# Patient Record
Sex: Female | Born: 1935 | Race: White | Hispanic: No | Smoking: Former smoker
Health system: Southern US, Community
[De-identification: ages and names within clinical notes are randomized; demographics above are authoritative.]

## PROBLEM LIST (undated history)

## (undated) DIAGNOSIS — E785 Hyperlipidemia, unspecified: Secondary | ICD-10-CM

## (undated) DIAGNOSIS — C50919 Malignant neoplasm of unspecified site of unspecified female breast: Secondary | ICD-10-CM

## (undated) DIAGNOSIS — I2510 Atherosclerotic heart disease of native coronary artery without angina pectoris: Secondary | ICD-10-CM

## (undated) DIAGNOSIS — E079 Disorder of thyroid, unspecified: Secondary | ICD-10-CM

## (undated) HISTORY — DX: Hyperlipidemia, unspecified: E78.5

## (undated) HISTORY — DX: Atherosclerotic heart disease of native coronary artery without angina pectoris: I25.10

## (undated) HISTORY — PX: OTHER SURGICAL HISTORY: SHX169

## (undated) HISTORY — DX: Malignant neoplasm of unspecified site of unspecified female breast: C50.919

## (undated) HISTORY — PX: CORONARY ANGIOPLASTY: SHX604

## (undated) HISTORY — PX: BREAST LUMPECTOMY: SHX2

## (undated) HISTORY — PX: CARDIAC CATHETERIZATION: SHX172

---

## 2012-04-20 ENCOUNTER — Inpatient Hospital Stay (HOSPITAL_COMMUNITY)
Admission: EM | Admit: 2012-04-20 | Discharge: 2012-04-22 | DRG: 069 | Disposition: A | Discharge status: Home / Self Care | Payer: Medicare Other | Attending: Internal Medicine | Admitting: Internal Medicine

## 2012-04-20 ENCOUNTER — Emergency Department (HOSPITAL_COMMUNITY): Payer: Medicare Other

## 2012-04-20 ENCOUNTER — Encounter (HOSPITAL_COMMUNITY): Payer: Self-pay | Admitting: Nurse Practitioner

## 2012-04-20 DIAGNOSIS — G459 Transient cerebral ischemic attack, unspecified: Principal | ICD-10-CM | POA: Diagnosis present

## 2012-04-20 DIAGNOSIS — Z79899 Other long term (current) drug therapy: Secondary | ICD-10-CM

## 2012-04-20 DIAGNOSIS — E119 Type 2 diabetes mellitus without complications: Secondary | ICD-10-CM | POA: Diagnosis present

## 2012-04-20 DIAGNOSIS — E032 Hypothyroidism due to medicaments and other exogenous substances: Secondary | ICD-10-CM

## 2012-04-20 DIAGNOSIS — R42 Dizziness and giddiness: Secondary | ICD-10-CM

## 2012-04-20 DIAGNOSIS — E782 Mixed hyperlipidemia: Secondary | ICD-10-CM

## 2012-04-20 DIAGNOSIS — E860 Dehydration: Secondary | ICD-10-CM | POA: Diagnosis present

## 2012-04-20 DIAGNOSIS — E785 Hyperlipidemia, unspecified: Secondary | ICD-10-CM | POA: Diagnosis present

## 2012-04-20 DIAGNOSIS — Z853 Personal history of malignant neoplasm of breast: Secondary | ICD-10-CM

## 2012-04-20 DIAGNOSIS — E039 Hypothyroidism, unspecified: Secondary | ICD-10-CM | POA: Diagnosis present

## 2012-04-20 HISTORY — DX: Disorder of thyroid, unspecified: E07.9

## 2012-04-20 LAB — CBC WITH DIFFERENTIAL/PLATELET
Eosinophils Relative: 2 % (ref 0–5)
HCT: 37.8 % (ref 36.0–46.0)
Hemoglobin: 12.6 g/dL (ref 12.0–15.0)
Lymphocytes Relative: 26 % (ref 12–46)
MCHC: 33.3 g/dL (ref 30.0–36.0)
MCV: 84.9 fL (ref 78.0–100.0)
Monocytes Absolute: 0.5 10*3/uL (ref 0.1–1.0)
Monocytes Relative: 7 % (ref 3–12)
Neutro Abs: 4.6 10*3/uL (ref 1.7–7.7)
WBC: 7.2 10*3/uL (ref 4.0–10.5)

## 2012-04-20 LAB — CBC
HCT: 35.5 % — ABNORMAL LOW (ref 36.0–46.0)
Hemoglobin: 11.3 g/dL — ABNORMAL LOW (ref 12.0–15.0)
MCH: 27 pg (ref 26.0–34.0)
MCV: 84.9 fL (ref 78.0–100.0)
Platelets: 222 10*3/uL (ref 150–400)
RBC: 4.18 MIL/uL (ref 3.87–5.11)
WBC: 6.1 10*3/uL (ref 4.0–10.5)

## 2012-04-20 LAB — COMPREHENSIVE METABOLIC PANEL
BUN: 14 mg/dL (ref 6–23)
CO2: 22 mEq/L (ref 19–32)
Calcium: 9.8 mg/dL (ref 8.4–10.5)
Chloride: 104 mEq/L (ref 96–112)
Creatinine, Ser: 0.76 mg/dL (ref 0.50–1.10)
GFR calc Af Amer: >90 mL/min (ref >90)
GFR calc non Af Amer: 79 mL/min — ABNORMAL LOW (ref >90)
Glucose, Bld: 80 mg/dL (ref 70–99)
Total Bilirubin: 0.2 mg/dL — ABNORMAL LOW (ref 0.3–1.2)

## 2012-04-20 LAB — T4, FREE: Free T4: 1.32 ng/dL (ref 0.80–1.80)

## 2012-04-20 LAB — CARDIAC PANEL(CRET KIN+CKTOT+MB+TROPI)
CK, MB: 3.4 ng/mL (ref 0.3–4.0)
CK, MB: 3 ng/mL (ref 0.3–4.0)
Relative Index: INVALID (ref 0.0–2.5)
Total CK: 79 U/L (ref 7–177)
Total CK: 61 U/L (ref 7–177)
Troponin I: <0.3 ng/mL (ref <0.30)

## 2012-04-20 LAB — CREATININE, SERUM: GFR calc Af Amer: 75 mL/min — ABNORMAL LOW (ref >90)

## 2012-04-20 MED ORDER — SODIUM CHLORIDE 0.9 % IV BOLUS (SEPSIS)
500.0000 mL | Freq: Once | INTRAVENOUS | Status: AC
  Administered 2012-04-20: 500 mL via INTRAVENOUS

## 2012-04-20 MED ORDER — SODIUM CHLORIDE 0.9 % IV SOLN
INTRAVENOUS | Status: DC
  Administered 2012-04-20 – 2012-04-21 (×2): via INTRAVENOUS

## 2012-04-20 MED ORDER — ASPIRIN 325 MG PO TABS
325.0000 mg | ORAL_TABLET | Freq: Every day | ORAL | Status: DC
  Administered 2012-04-20 – 2012-04-22 (×3): 325 mg via ORAL
  Filled 2012-04-20 (×3): qty 1

## 2012-04-20 MED ORDER — INSULIN ASPART 100 UNIT/ML ~~LOC~~ SOLN: 0.0000 [IU] | Freq: Every day | SUBCUTANEOUS | Status: DC

## 2012-04-20 MED ORDER — MECLIZINE HCL 25 MG PO TABS
25.0000 mg | ORAL_TABLET | Freq: Once | ORAL | Status: AC
  Administered 2012-04-20: 25 mg via ORAL
  Filled 2012-04-20: qty 1

## 2012-04-20 MED ORDER — ATORVASTATIN CALCIUM 10 MG PO TABS
10.0000 mg | ORAL_TABLET | Freq: Every day | ORAL | Status: DC
  Administered 2012-04-20 – 2012-04-21 (×2): 10 mg via ORAL
  Filled 2012-04-20 (×3): qty 1

## 2012-04-20 MED ORDER — LEVOTHYROXINE SODIUM 112 MCG PO TABS
112.0000 ug | ORAL_TABLET | Freq: Every day | ORAL | Status: DC
  Administered 2012-04-20 – 2012-04-22 (×3): 112 ug via ORAL
  Filled 2012-04-20 (×3): qty 1

## 2012-04-20 MED ORDER — ASPIRIN 81 MG PO CHEW
CHEWABLE_TABLET | ORAL | Status: AC
  Filled 2012-04-20: qty 4

## 2012-04-20 MED ORDER — ASPIRIN 300 MG RE SUPP
300.0000 mg | Freq: Every day | RECTAL | Status: DC
  Filled 2012-04-20 (×3): qty 1

## 2012-04-20 MED ORDER — INSULIN ASPART 100 UNIT/ML ~~LOC~~ SOLN
0.0000 [IU] | Freq: Three times a day (TID) | SUBCUTANEOUS | Status: DC
  Administered 2012-04-21: 2 [IU] via SUBCUTANEOUS
  Administered 2012-04-22: 1 [IU] via SUBCUTANEOUS
  Administered 2012-04-22: 2 [IU] via SUBCUTANEOUS

## 2012-04-20 MED ORDER — SENNOSIDES-DOCUSATE SODIUM 8.6-50 MG PO TABS
1.0000 | ORAL_TABLET | Freq: Every evening | ORAL | Status: DC | PRN
  Filled 2012-04-20: qty 1

## 2012-04-20 MED ORDER — GADOBENATE DIMEGLUMINE 529 MG/ML IV SOLN
15.0000 mL | Freq: Once | INTRAVENOUS | Status: AC
  Administered 2012-04-20: 15 mL via INTRAVENOUS

## 2012-04-20 MED ORDER — ENOXAPARIN SODIUM 40 MG/0.4ML ~~LOC~~ SOLN
40.0000 mg | SUBCUTANEOUS | Status: DC
  Administered 2012-04-20 – 2012-04-21 (×2): 40 mg via SUBCUTANEOUS
  Filled 2012-04-20 (×3): qty 0.4

## 2012-04-20 MED ORDER — ONDANSETRON HCL 4 MG/2ML IJ SOLN: 4.0000 mg | Freq: Four times a day (QID) | INTRAMUSCULAR | Status: DC | PRN

## 2012-04-20 NOTE — ED Provider Notes (Signed)
5:40 PM Patient signed out to me by Doran Durand PA-C. Patient was placed in CDU pending MRI do to possible subacute ischemia on CT scan. MRI was done incorrectly and unlikely will be able to received definitive results. Cannot repeat MRI for another 24 hours. Discussed patient with Dr. Weldon Inches. Patient will be admitted due symptoms and likely plan an MRI in 24 hours. Discussed this with patient and family. They voice understanding. Pt is visiting daughter from [facility]. PCP in [facility] is Dr. Lawanna Kobus   6:00 PM I have spoken with dr. Blake Divine, [facility] who accept patient for admission.   Telemetry: team 6: Dr. Ofilia Neas, PA-C 04/20/12 1801

## 2012-04-20 NOTE — ED Provider Notes (Signed)
Medical screening examination/treatment/procedure(s) were conducted as a shared visit with non-physician practitioner(s) and myself.  I personally evaluated the patient during the encounter   Glynn Octave, MD 04/20/12 1656

## 2012-04-20 NOTE — ED Provider Notes (Signed)
History     CSN: [id number]  Arrival date & time 04/20/12  4098   First MD Initiated Contact with Patient 04/20/12 1019      Chief Complaint  Patient presents with  . Dizziness    (Consider location/radiation/quality/duration/timing/severity/associated sxs/prior treatment) HPI Comments: Awoke this morning with dizziness and vertigo with one episode of vomiting.  Similar symptoms last week that resolved on their own.  No chest pain, SOB, abdominal pain, headache, weakness, numbness, tingling. Change in thyroid medication several weeks ago.  Visiting from [facility]. No history of heart or lung problems.  The history is provided by the patient and a relative.    Past Medical History  Diagnosis Date  . Diabetes mellitus   . Breast cancer   . Thyroid disease   . High cholesterol     Past Surgical History  Procedure Date  . Breast lumpectomy     History reviewed. No pertinent family history.  History  Substance Use Topics  . Smoking status: Never Smoker   . Smokeless tobacco: Not on file  . Alcohol Use: No    OB History    Grav Para Term Preterm Abortions TAB SAB Ect Mult Living                  Review of Systems  Constitutional: Positive for activity change and appetite change. Negative for fever.  HENT: Negative for congestion and rhinorrhea.   Respiratory: Negative for cough, chest tightness and shortness of breath.   Cardiovascular: Negative for chest pain.  Gastrointestinal: Positive for nausea and vomiting. Negative for abdominal pain.  Genitourinary: Negative for dysuria and hematuria.  Musculoskeletal: Negative for back pain.  Skin: Negative for rash.  Neurological: Positive for dizziness. Negative for syncope, weakness, light-headedness and headaches.    Allergies  Review of patient's allergies indicates no known allergies.  Home Medications   Current Outpatient Rx  Name Route Sig Dispense Refill  . ASPIRIN 81 MG PO TABS Oral Take 81 mg by mouth daily.     Marland Kitchen GLIPIZIDE ER 5 MG PO TB24 Oral Take 5 mg by mouth daily.    Marland Kitchen LEVOTHYROXINE SODIUM 112 MCG PO TABS Oral Take 112 mcg by mouth daily.    Marland Kitchen LIRAGLUTIDE 18 MG/3ML [facility] SOLN Subcutaneous Inject 1.2 mg into the skin every morning.    Marland Kitchen METFORMIN HCL 1000 MG PO TABS Oral Take 1,000 mg by mouth 2 (two) times daily with a meal.    . ROSUVASTATIN CALCIUM 5 MG PO TABS Oral Take 5 mg by mouth daily.      BP 136/73  Pulse 81  Temp 97.7 F (36.5 C) (Oral)  Resp 16  Ht 5\' 3"  (1.6 m)  Wt 158 lb (71.668 kg)  BMI 27.99 kg/m2  SpO2 99%  Physical Exam  Constitutional: She is oriented to person, place, and time. She appears well-developed and well-nourished. No distress.  HENT:  Head: Normocephalic and atraumatic.  Mouth/Throat: Oropharynx is clear and moist. No oropharyngeal exudate.  Eyes: Conjunctivae are normal. Pupils are equal, round, and reactive to light.  Neck: Normal range of motion. Neck supple.  Cardiovascular: Normal rate, regular rhythm and normal heart sounds.   No murmur heard. Pulmonary/Chest: Effort normal and breath sounds normal. No respiratory distress.  Abdominal: Soft. There is no tenderness. There is no rebound and no guarding.  Musculoskeletal: Normal range of motion. She exhibits no edema and no tenderness.  Neurological: She is alert and oriented to person, place, and time. No  cranial nerve deficit.       5/5 strength throughout, cranial nerves 3-12 intact. No nystagmus, no ataxia on finger to nose. +Romberg, ataxic gait. Test of skew negative. Head impulse testing negative.  Skin: Skin is warm.    ED Course  Procedures (including critical care time)  Labs Reviewed  COMPREHENSIVE METABOLIC PANEL - Abnormal; Notable for the following:    Total Bilirubin 0.2 (*)     GFR calc non Af Amer 79 (*)     All other components within normal limits  CBC WITH DIFFERENTIAL  CARDIAC PANEL(CRET KIN+CKTOT+MB+TROPI)  TSH  T4, FREE   Ct Head Wo Contrast  04/20/2012  *RADIOLOGY  REPORT*  Clinical Data: Dizziness.  Emesis.  CT HEAD WITHOUT CONTRAST  Technique:  Contiguous axial images were obtained from the base of the skull through the vertex without contrast.  Comparison: No priors.  Findings: Images 23 and 24 of series 2 demonstrate an area of low attenuation in the left frontal lobe white matter, favored to represent sequela of prior infarction (however, the margins are slightly indistinct).  Cerebral and cerebellar atrophy is present, and is age appropriate.  In addition, there are patchy and confluent areas of decreased attenuation throughout the deep and periventricular white matter of the cerebral hemispheres bilaterally, compatible with chronic microvascular ischemic changes.  No definite region of acute/subacute cerebral ischemia, no acute intracranial hemorrhage, no focal mass, mass effect, hydrocephalus or abnormal intra or extra-axial fluid collections. No acute displaced skull fractures are identified.  Visualized paranasal sinuses and mastoids are well pneumatized.  IMPRESSION: 1.  No definite acute intracranial abnormalities. There is, however, one well defined area of low attenuation has slight indistinctness of its margins in the anterior left frontal lobe white matter which could conceivably represent an area of evolving subacute ischemia, although this is more strongly favored to represent sequela of remote prior infarction.  This would require MRI for better discrimination if clinically indicated. 2.  Cerebral and cerebellar atrophy (age appropriate) with chronic microvascular ischemic changes in the cerebral white matter, as above.  Original Report Authenticated By: Florencia Reasons, M.D.     No diagnosis found.    MDM  Vertigo with nausea and vomiting.  No focal neuro deficits.  +Romberg and ataxic gait. Suspect peripheral vertigo given acute onset and lack of neuro findings.  CT, Labs, EKG, symptom control Symptoms improved with medications. Given CT  findings, will proceed with MRI. Move to CDU awaiting MRI.   Date: 04/20/2012  Rate: 77  Rhythm: normal sinus rhythm  QRS Axis: normal  Intervals: normal  ST/T Wave abnormalities: normal  Conduction Disutrbances:none  Narrative Interpretation:   Old EKG Reviewed: none available          Glynn Octave, MD 04/20/12 [phone]

## 2012-04-20 NOTE — ED Notes (Signed)
Patient transported to CT 

## 2012-04-20 NOTE — H&P (Signed)
[facility] History and Physical  Lacey Kemp ZOX:[id number] DOB: 10/13/1931 DOA: 04/20/2012  PCP: Provider Not In System   Chief Complaint: dizziness today followed by nausea and an episode of vomiting.  HPI:  78 year old lady with h/o DM, hypothyroidism was brought in by her daughter for an episode of dizziness associated with nausea and vomiting. She was unsteady on her feet. On arrival to ED, she was given meclizine . Her dizziness and nausea improved. But when she tried to walk, she was unsteady and was swaying to her right side. Her workup included a CT brain which was slightly abnormal and was followed up with an MRI of the brain, but there was a malfunction of the MRI machine and the patient was not stable to be discharged home. She is being admitted for evaluation of TIA . Patient is a good historian, and she denies any chest pain, shortness of breath, palpitations, headache, blurry vision, ear pain, tinnitus. She denies abdominal pain, diarrhea. Her nausea has improved. Her dizziness has improved.  Review of Systems:   See HPI otherwise negative.  Past Medical History  Diagnosis Date  . Diabetes mellitus   . Breast cancer   . Thyroid disease   . High cholesterol    Past Surgical History  Procedure Date  . Breast lumpectomy    Social History:  reports that she has never smoked. She does not have any smokeless tobacco history on file. She reports that she does not drink alcohol or use illicit drugs.  No Known Allergies  History reviewed. No pertinent family history.  Prior to Admission medications   Medication Sig Start Date End Date Taking? Authorizing Provider  aspirin 81 MG tablet Take 81 mg by mouth daily.   Yes Historical Provider, MD  glipiZIDE (GLUCOTROL XL) 5 MG 24 hr tablet Take 5 mg by mouth daily.   Yes Historical Provider, MD  levothyroxine (SYNTHROID, LEVOTHROID) 112 MCG tablet Take 112 mcg by mouth daily.   Yes Historical Provider, MD  Liraglutide  (VICTOZA) 18 MG/3ML SOLN Inject 1.2 mg into the skin every morning.   Yes Historical Provider, MD  metFORMIN (GLUCOPHAGE) 1000 MG tablet Take 1,000 mg by mouth 2 (two) times daily with a meal.   Yes Historical Provider, MD  rosuvastatin (CRESTOR) 5 MG tablet Take 5 mg by mouth daily.   Yes Historical Provider, MD   Physical Exam: Filed Vitals:   04/20/12 1317 04/20/12 1411 04/20/12 1445 04/20/12 1707  BP: 138/61 144/62 146/75 151/66  Pulse: 67 69 74 70  Temp:      TempSrc:      Resp: 18 18 15 14   Height:      Weight:      SpO2: 98% 98% 95% 99%    Constitutional: Vital signs reviewed.  Patient is a well-developed and well-nourished  in no acute distress and cooperative with exam. Alert and oriented x3.  Head: Normocephalic and atraumatic Mouth: no erythema or exudates, MMM Eyes: PERRL, EOMI, conjunctivae normal, No scleral icterus.  Neck: Supple, Trachea midline normal ROM, No JVD, mass, thyromegaly, or carotid bruit present.  Cardiovascular: RRR, S1 normal, S2 normal, no MRG, pulses symmetric and intact bilaterally Pulmonary/Chest: CTAB, no wheezes, rales, or rhonchi Abdominal: Soft. Non-tender, non-distended, bowel sounds are normal, no masses, organomegaly, or guarding present.  GU: no CVA tenderness Musculoskeletal: No joint deformities, erythema, or stiffness, ROM full and no nontender Hematology: no cervical, inginal, or axillary adenopathy.  Neurological: A&O x3, Strenght is normal and  symmetric bilaterally, cranial nerve II-XII are grossly intact, no focal motor deficit, sensory intact to light touch bilaterally. Unsteady on her feet when she tried to walk. Skin: Warm, dry and intact. No rash, cyanosis, or clubbing.  Psychiatric: Normal mood and affect.   Labs on Admission:  Basic Metabolic Panel:  Lab 04/20/12 7846  NA 140  K 4.2  CL 104  CO2 22  GLUCOSE 80  BUN 14  CREATININE 0.76  CALCIUM 9.8  MG --  PHOS --   Liver Function Tests:  Lab 04/20/12 1100  AST  16  ALT 13  ALKPHOS 84  BILITOT 0.2*  PROT 7.4  ALBUMIN 4.0   No results found for this basename: LIPASE:5,AMYLASE:5 in the last 168 hours No results found for this basename: AMMONIA:5 in the last 168 hours CBC:  Lab 04/20/12 1100  WBC 7.2  NEUTROABS 4.6  HGB 12.6  HCT 37.8  MCV 84.9  PLT 230   Cardiac Enzymes:  Lab 04/20/12 1100  CKTOTAL 79  CKMB 3.4  CKMBINDEX --  TROPONINI <0.30   BNP: No components found with this basename: POCBNP:5 CBG: No results found for this basename: GLUCAP:5 in the last 168 hours  Radiological Exams on Admission: Ct Head Wo Contrast  04/20/2012  *RADIOLOGY REPORT*  Clinical Data: Dizziness.  Emesis.  CT HEAD WITHOUT CONTRAST  Technique:  Contiguous axial images were obtained from the base of the skull through the vertex without contrast.  Comparison: No priors.  Findings: Images 23 and 24 of series 2 demonstrate an area of low attenuation in the left frontal lobe white matter, favored to represent sequela of prior infarction (however, the margins are slightly indistinct).  Cerebral and cerebellar atrophy is present, and is age appropriate.  In addition, there are patchy and confluent areas of decreased attenuation throughout the deep and periventricular white matter of the cerebral hemispheres bilaterally, compatible with chronic microvascular ischemic changes.  No definite region of acute/subacute cerebral ischemia, no acute intracranial hemorrhage, no focal mass, mass effect, hydrocephalus or abnormal intra or extra-axial fluid collections. No acute displaced skull fractures are identified.  Visualized paranasal sinuses and mastoids are well pneumatized.  IMPRESSION: 1.  No definite acute intracranial abnormalities. There is, however, one well defined area of low attenuation has slight indistinctness of its margins in the anterior left frontal lobe white matter which could conceivably represent an area of evolving subacute ischemia, although this is  more strongly favored to represent sequela of remote prior infarction.  This would require MRI for better discrimination if clinically indicated. 2.  Cerebral and cerebellar atrophy (age appropriate) with chronic microvascular ischemic changes in the cerebral white matter, as above.  Original Report Authenticated By: Florencia Reasons, M.D.    EKG: NSR.  Assessment/Plan Active Problems:    1. Vertigo: resolved with meclizine. Abnormal CT brain showing areas of ischemia, followed by an MRI of the brain which is pending. We will admit to tele for evaluation of TIA. She passed bedside swallow evaluation. Start her on carb modified diet. Start aspirin 325mg  daily. Will get MRI/MRA of the brain, head and neck. Echo and carotid duplex are pending. Lipid panel and hga1c ordered. Resume home doses of lipitor. Patient is still unsteady on her feet. PT/OT evaluation. Will get neurology consult in am depending on the MRI findings.  2. Diabetes Mellitus: start her SSI. Will hold oral hypoglycemics. 3. Hypothyroidism: TSH low but free t4 within normal limits. continue home dose of synthroid. 4. DVT prophylaxis;  lovenox.    Kathlen Mody, MD  [facility] Pager [phone]  If 7PM-7AM, please contact night-coverage [email or web address] Password [facility] 04/20/2012, 7:00 PM

## 2012-04-20 NOTE — ED Notes (Signed)
Meal tray has been ordered, documented swallow screen has been done.

## 2012-04-20 NOTE — ED Notes (Signed)
Pt returned from MRI °

## 2012-04-20 NOTE — ED Notes (Signed)
States she woke up this am and noticed she was very dizzy. States she was so dizzy she vomited. States she felt the same way 2 days ago and vomited multiple times but did not seek treatment at that time. Pt is A&Ox4. Denies any head injury, denies pain.

## 2012-04-20 NOTE — ED Notes (Addendum)
C/o not feeling well Sunday, n/v/d with emesis x1, diarrhea x 5. Denies now. Had an episode of dizziness Monday. This am awoke feeling dizzy when she got out of bed, emesis x1 due to dizziness. Denies CP, palpitations.  Reports dizziness worsens with mvmt & position changes.

## 2012-04-20 NOTE — ED Provider Notes (Signed)
Assumed care of patient in the CDU from Dr. Manus Gunning.  Patient presented to the ED today with a chief complaint of vertigo, vomiting, and ataxia.  Patient is currently awaiting MRI brain to rule out stroke.  Plan is for patient to be discharged home if MRI is negative.  1:45 PM Reassessed patient.  She denies any vertigo or nausea at this time.  Patient alert and orientated x 3, Heart RRR, Lungs CTAB, CN II-XII grossly intact, normal finger to nose testing, grip strength 5/5 bilaterally, lower extremity muscle strength 5/5 bilaterally. Patient awaiting MRI. 3:36 PM Patient signed out to Diego Cory, PA-C who assumes care of patient.    Pascal [place], PA-C 04/20/12 1537

## 2012-04-21 ENCOUNTER — Inpatient Hospital Stay (HOSPITAL_COMMUNITY): Payer: Medicare Other

## 2012-04-21 ENCOUNTER — Other Ambulatory Visit (HOSPITAL_COMMUNITY): Payer: 59

## 2012-04-21 DIAGNOSIS — G459 Transient cerebral ischemic attack, unspecified: Secondary | ICD-10-CM

## 2012-04-21 DIAGNOSIS — E032 Hypothyroidism due to medicaments and other exogenous substances: Secondary | ICD-10-CM

## 2012-04-21 DIAGNOSIS — E782 Mixed hyperlipidemia: Secondary | ICD-10-CM

## 2012-04-21 DIAGNOSIS — I6789 Other cerebrovascular disease: Secondary | ICD-10-CM

## 2012-04-21 LAB — LIPID PANEL
Cholesterol: 125 mg/dL (ref 0–200)
Total CHOL/HDL Ratio: 2.5 RATIO

## 2012-04-21 LAB — GLUCOSE, CAPILLARY: Glucose-Capillary: 140 mg/dL — ABNORMAL HIGH (ref 70–99)

## 2012-04-21 LAB — BASIC METABOLIC PANEL
BUN: 16 mg/dL (ref 6–23)
Chloride: 104 mEq/L (ref 96–112)
Creatinine, Ser: 0.91 mg/dL (ref 0.50–1.10)
GFR calc non Af Amer: 59 mL/min — ABNORMAL LOW (ref >90)
Glucose, Bld: 118 mg/dL — ABNORMAL HIGH (ref 70–99)
Potassium: 4 mEq/L (ref 3.5–5.1)

## 2012-04-21 LAB — CARDIAC PANEL(CRET KIN+CKTOT+MB+TROPI)
CK, MB: 2.7 ng/mL (ref 0.3–4.0)
Relative Index: INVALID (ref 0.0–2.5)
Relative Index: INVALID (ref 0.0–2.5)
Total CK: 58 U/L (ref 7–177)
Troponin I: <0.3 ng/mL (ref <0.30)

## 2012-04-21 LAB — HEMOGLOBIN A1C
Hgb A1c MFr Bld: 6.8 % — ABNORMAL HIGH (ref <5.7)
Mean Plasma Glucose: 148 mg/dL — ABNORMAL HIGH (ref <117)

## 2012-04-21 MED ORDER — GADOBENATE DIMEGLUMINE 529 MG/ML IV SOLN
15.0000 mL | Freq: Once | INTRAVENOUS | Status: AC | PRN
  Administered 2012-04-21: 15 mL via INTRAVENOUS

## 2012-04-21 NOTE — Evaluation (Signed)
Occupational Therapy Evaluation Patient Details Name: Tanji Storrs MRN: [id number] DOB: [birth year] Today's Date: 04/21/2012 Time: 7829-5621 OT Time Calculation (min): 26 min  OT Assessment / Plan / Recommendation Clinical Impression  78 yo female admitted due to dizzy spells ? vertigo v/s TIA. MRI pending. OT to sign off no acute needs at this time.    OT Assessment  Patient does not need any further OT services    Follow Up Recommendations  No OT follow up    Barriers to Discharge      Equipment Recommendations  None recommended by PT    Recommendations for Other Services    Frequency       Precautions / Restrictions Precautions Precautions: None   Pertinent Vitals/Pain none    ADL  Eating/Feeding: Simulated;Independent Where Assessed - Eating/Feeding: Chair Grooming: Performed;Wash/dry hands;Independent Where Assessed - Grooming: Unsupported standing Toilet Transfer: Independent Toilet Transfer Method: Sit to Barista: Regular height toilet Toileting - Clothing Manipulation and Hygiene: Performed;Independent Where Assessed - Toileting Clothing Manipulation and Hygiene: Sit to stand from 3-in-1 or toilet Equipment Used: Gait belt Transfers/Ambulation Related to ADLs: Pt ambulated >100 ft in the hallway with head turn challenges. pt with nystagmus noted once for ~3 or 4 beats to the right side. ADL Comments: Pt is at baseline of function for all adls. No further education needed. Pt with nystagmus noted however beating is within normal range for nystagmus beats to the right    OT Diagnosis:    OT Problem List:   OT Treatment Interventions:     OT Goals    Visit Information  Last OT Received On: 04/21/12 Assistance Needed: +1 PT/OT Co-Evaluation/Treatment: Yes (due to vertigo assessment workup)    Subjective Data  Subjective: "i just like to see my kids" pts reason for alternating house holds during the year Patient Stated Goal: to  go back home   Prior Functioning  Home Living Lives With: Other (Comment) (granddaughter just married and now will be alone) Available Help at Discharge: Available PRN/intermittently (alternates from daughter to daughter house) Type of Home: House Home Access: Stairs to enter;Ramped entrance (uses stairs) Secretary/administrator of Steps: 4 Entrance Stairs-Rails: Left;Right Home Layout: Two level;Full bath on main level (could put bed in living) Bathroom Shower/Tub: Walk-in shower;Door [employer] Toilet: Handicapped height Bathroom Accessibility: Yes How Accessible: Accessible via walker Home Adaptive Equipment: Dan Humphreys - four wheeled;Walker - standard;Bedside commode/3-in-1;Reacher;Shower chair with back;Grab bars in shower;Grab bars around toilet;Hand-held shower hose Additional Comments: has to complete 3 steps,  one daughter with standard commode height Prior Function Level of Independence: Independent Able to Take Stairs?: Yes Driving: No Vocation: Retired Musician: No difficulties Dominant Hand: Right    Cognition  Overall Cognitive Status: Appears within functional limits for tasks assessed/performed Arousal/Alertness: Awake/alert Orientation Level: Appears intact for tasks assessed Behavior During Session: [facility] for tasks performed    Extremity/Trunk Assessment Right Upper Extremity Assessment RUE ROM/Strength/Tone: Within functional levels Left Upper Extremity Assessment LUE ROM/Strength/Tone: Within functional levels Right Lower Extremity Assessment RLE ROM/Strength/Tone: WFL for tasks assessed Left Lower Extremity Assessment LLE ROM/Strength/Tone: [facility] for tasks assessed Trunk Assessment Trunk Assessment: Normal   Mobility Bed Mobility Bed Mobility: Supine to Sit Supine to Sit: 7: Independent;HOB elevated;With rails (hob 37 ) Transfers Transfers: Sit to Stand;Stand to Sit Sit to Stand: 6: Modified independent (Device/Increase time);With upper  extremity assist;From bed Stand to Sit: 6: Modified independent (Device/Increase time);To bed;With upper extremity assist   Exercise Other Exercises Other Exercises:  Due to slight dizziness on 1st attempt to turn head to Rt, performed seated vestibular assessment. Noted 2-3 beats of nystagmus (very slight) on gaze holding with eyes to Rt (Rt beating), but no symptoms.  Pt with normal VOR and negative head thrust. Denies fullness or recent loss of hearing in either ear.  Balance Balance Balance Assessed: Yes Static Standing Balance Single Leg Stance - Right Leg: 10  Single Leg Stance - Left Leg: 7  Tandem Stance - Left Leg: 30  Rhomberg - Eyes Opened: 30  Rhomberg - Eyes Closed: 30  (initial feeling of offbalance and opened eyes (no LOB)) High Level Balance High Level Balance Activites: Direction changes;Turns;Sudden stops;Head turns (no losses of balance; slight dizzy feel when turned head Rt or Lt)  End of Session OT - End of Session Activity Tolerance: Patient tolerated treatment well  GO     Harrel Carina [facility] 04/21/2012, 1:13 PM Pager: [phone]

## 2012-04-21 NOTE — Progress Notes (Signed)
[facility] PROGRESS NOTE  Earleen Aoun NFA:[id number] DOB: 1931-06-25 DOA: 04/20/2012 PCP: Provider Not In System  Assessment/Plan: 1. TIA; WORK UP STILL IN PROGRESS. CT head was abnormal. Mri brain done yesterday was not done properly and she awaiting for the repeat test to be done today. ECHO was within normal limits. Lipid panel was good. Continue with aspirin/ lipitor/. 2. Hypothyroidism: ocntinue with synthroid 3. DVT prophylaxis: lovenox.     Kathlen Mody, MD  [facility] Pager [phone]  If 7PM-7AM, please contact night-coverage [email or web address] Password [email or web address] 04/21/2012, 8:19 PM   LOS: 1 day   Brief narrative: 78 year old lady with h/o DM, hypothyroidism was brought in by her daughter for an episode of dizziness associated with nausea and vomiting   Consultants:  none  Procedures:  MRI of the brain.  Antibiotics:  none  HPI/Subjective: No new complaints.   Objective: Filed Vitals:   04/21/12 0900 04/21/12 1200 04/21/12 1403 04/21/12 1600  BP: 128/76 135/62 133/62 120/62  Pulse: 77 73 73 70  Temp:  97.9 F (36.6 C) 98 F (36.7 C) 97.9 F (36.6 C)  TempSrc:  Oral Oral Oral  Resp: 20 20 20 20   Height:      Weight:      SpO2: 98% 100% 100% 97%    Intake/Output Summary (Last 24 hours) at 04/21/12 2019 Last data filed at 04/21/12 1400  Gross per 24 hour  Intake 766.67 ml  Output      0 ml  Net 766.67 ml    Exam:   General:  Alert afebrile comfortable  Cardiovascular: s1s2 RRR  Respiratory: CTAB, No wheezing or rhonchi.  Abdomen: soft NT ND BS +  Extremities; no pedal edema  Data Reviewed: Basic Metabolic Panel:  Lab 04/21/12 2841 04/20/12 2143 04/20/12 1100  NA 139 -- 140  K 4.0 -- 4.2  CL 104 -- 104  CO2 24 -- 22  GLUCOSE 118* -- 80  BUN 16 -- 14  CREATININE 0.91 0.84 0.76  CALCIUM 9.2 -- 9.8  MG -- -- --  PHOS -- -- --   Liver Function Tests:  Lab 04/20/12 1100  AST 16  ALT 13  ALKPHOS 84    BILITOT 0.2*  PROT 7.4  ALBUMIN 4.0   No results found for this basename: LIPASE:5,AMYLASE:5 in the last 168 hours No results found for this basename: AMMONIA:5 in the last 168 hours CBC:  Lab 04/20/12 2143 04/20/12 1100  WBC 6.1 7.2  NEUTROABS -- 4.6  HGB 11.3* 12.6  HCT 35.5* 37.8  MCV 84.9 84.9  PLT 222 230   Cardiac Enzymes:  Lab 04/21/12 1323 04/21/12 0625 04/20/12 2143 04/20/12 1100  CKTOTAL 57 58 61 79  CKMB 2.7 2.8 3.0 3.4  CKMBINDEX -- -- -- --  TROPONINI <0.30 <0.30 <0.30 <0.30   BNP (last 3 results) No results found for this basename: PROBNP:3 in the last 8760 hours CBG:  Lab 04/21/12 1656 04/21/12 1156 04/21/12 0741 04/20/12 2110  GLUCAP 105* 154* 99 192*    No results found for this or any previous visit (from the past 240 hour(s)).   Studies: Dg Chest 2 View  04/21/2012  *RADIOLOGY REPORT*  Clinical Data:  78 year old female with dizziness and shortness of breath.  CHEST - 2 VIEW  Comparison: None.  Findings: There is cardiomegaly.  Other mediastinal contours are within normal limits.  Mild elevation of the right hemidiaphragm. No pneumothorax or pulmonary edema.  No pleural effusion or confluent pulmonary opacity.  IMPRESSION: Cardiomegaly. No acute cardiopulmonary abnormality.  Original Report Authenticated By: Harley Hallmark, M.D.   Ct Head Wo Contrast  04/20/2012  *RADIOLOGY REPORT*  Clinical Data: Dizziness.  Emesis.  CT HEAD WITHOUT CONTRAST  Technique:  Contiguous axial images were obtained from the base of the skull through the vertex without contrast.  Comparison: No priors.  Findings: Images 23 and 24 of series 2 demonstrate an area of low attenuation in the left frontal lobe white matter, favored to represent sequela of prior infarction (however, the margins are slightly indistinct).  Cerebral and cerebellar atrophy is present, and is age appropriate.  In addition, there are patchy and confluent areas of decreased attenuation throughout the deep and  periventricular white matter of the cerebral hemispheres bilaterally, compatible with chronic microvascular ischemic changes.  No definite region of acute/subacute cerebral ischemia, no acute intracranial hemorrhage, no focal mass, mass effect, hydrocephalus or abnormal intra or extra-axial fluid collections. No acute displaced skull fractures are identified.  Visualized paranasal sinuses and mastoids are well pneumatized.  IMPRESSION: 1.  No definite acute intracranial abnormalities. There is, however, one well defined area of low attenuation has slight indistinctness of its margins in the anterior left frontal lobe white matter which could conceivably represent an area of evolving subacute ischemia, although this is more strongly favored to represent sequela of remote prior infarction.  This would require MRI for better discrimination if clinically indicated. 2.  Cerebral and cerebellar atrophy (age appropriate) with chronic microvascular ischemic changes in the cerebral white matter, as above.  Original Report Authenticated By: Florencia Reasons, M.D.    Scheduled Meds:   . aspirin      . aspirin  300 mg Rectal Daily   Or  . aspirin  325 mg Oral Daily  . atorvastatin  10 mg Oral q1800  . enoxaparin  40 mg Subcutaneous Q24H  . insulin aspart  0-5 Units Subcutaneous QHS  . insulin aspart  0-9 Units Subcutaneous TID WC  . levothyroxine  112 mcg Oral Daily   Continuous Infusions:   . sodium chloride 50 mL/hr at 04/21/12 2017

## 2012-04-21 NOTE — Progress Notes (Signed)
Utilization review completed.  

## 2012-04-21 NOTE — ED Provider Notes (Signed)
Medical screening examination/treatment/procedure(s) were performed by non-physician practitioner and as supervising physician I was immediately available for consultation/collaboration.  Cheri Guppy, MD 04/21/12 [phone]

## 2012-04-21 NOTE — Evaluation (Signed)
Physical Therapy Evaluation Patient Details Name: Lacey Kemp MRN: [id number] DOB: July 14, 1931 Today's Date: 04/21/2012 Time: 4098-1191 PT Time Calculation (min): 33 min  PT Assessment / Plan / Recommendation Clinical Impression  Pt adm s/p dizzy spell (?vertigo--pt with difficulty describing if there was a sense of spinning or movement). Unable to elicit same feeling with vestibular and mobility assessment this date. Pt with no loss of balance and feels she is at her baseline.     PT Assessment  Patent does not need any further PT services    Follow Up Recommendations  No PT follow up    Barriers to Discharge        Equipment Recommendations  None recommended by PT    Recommendations for Other Services     Frequency      Precautions / Restrictions Precautions Precautions: None   Pertinent Vitals/Pain WNL on telemetry; no pain      Mobility  Bed Mobility Bed Mobility: Supine to Sit Supine to Sit: 7: Independent;HOB elevated;With rails (hob 37 ) Transfers Sit to Stand: 6: Modified independent (Device/Increase time);With upper extremity assist;From bed Stand to Sit: 6: Modified independent (Device/Increase time);To bed;With upper extremity assist Ambulation/Gait Ambulation/Gait Assistance: 7: Independent Ambulation Distance (Feet): 150 Feet Assistive device: None Ambulation/Gait Assistance Details: initially provided supervision to rule out imbalance/dizziness with pt with no difficulties and progressed to independence Gait Pattern: Within Functional Limits Modified Rankin (Stroke Patients Only) Pre-Morbid Rankin Score: No symptoms Modified Rankin: No symptoms    Exercises Other Exercises Other Exercises: Due to slight dizziness on 1st attempt to turn head to Rt, performed seated vestibular assessment. Noted 2-3 beats of nystagmus (very slight) on gaze holding with eyes to Rt (Rt beating), but no symptoms.  Pt with normal VOR and negative head thrust. Denies  fullness or recent loss of hearing in either ear.   PT Diagnosis:    PT Problem List:   PT Treatment Interventions:     PT Goals    Visit Information  Last PT Received On: 04/21/12 Assistance Needed: +1 PT/OT Co-Evaluation/Treatment: Yes    Subjective Data  Subjective: "I hope I don't need all this yet" (re: therapies) Patient Stated Goal: go home to daughter's today   Prior Functioning  Home Living Lives With: Other (Comment) (granddaughter just married and now will be alone) Available Help at Discharge: Available PRN/intermittently (alternates from daughter to daughter house) Type of Home: House Home Access: Stairs to enter;Ramped entrance (uses stairs) Secretary/administrator of Steps: 4 Entrance Stairs-Rails: Left;Right Home Layout: Two level;Full bath on main level (could put bed in living) Bathroom Shower/Tub: Walk-in shower;Door [employer] Toilet: Handicapped height Bathroom Accessibility: Yes How Accessible: Accessible via walker Home Adaptive Equipment: Dan Humphreys - four wheeled;Walker - standard;Bedside commode/3-in-1;Reacher;Shower chair with back;Grab bars in shower;Grab bars around toilet;Hand-held shower hose Additional Comments: has to complete 3 steps,  one daughter with standard commode height Prior Function Level of Independence: Independent Able to Take Stairs?: Yes Driving: No Vocation: Retired Musician: No difficulties Dominant Hand: Right    Cognition  Overall Cognitive Status: Appears within functional limits for tasks assessed/performed Arousal/Alertness: Awake/alert Orientation Level: Appears intact for tasks assessed Behavior During Session: [facility] for tasks performed    Extremity/Trunk Assessment Right Upper Extremity Assessment RUE ROM/Strength/Tone: Within functional levels Left Upper Extremity Assessment LUE ROM/Strength/Tone: Within functional levels Right Lower Extremity Assessment RLE ROM/Strength/Tone: [facility] for tasks  assessed Left Lower Extremity Assessment LLE ROM/Strength/Tone: [facility] for tasks assessed Trunk Assessment Trunk Assessment: Normal   Balance  Balance Balance Assessed: Yes Static Standing Balance Single Leg Stance - Right Leg: 10  Single Leg Stance - Left Leg: 7  Tandem Stance - Left Leg: 30  Rhomberg - Eyes Opened: 30  Rhomberg - Eyes Closed: 30  (initial feeling of offbalance and opened eyes (no LOB)) High Level Balance High Level Balance Activites: Direction changes;Turns;Sudden stops;Head turns (no losses of balance; slight dizzy feel when first  turned head to the right when walking; was not able to reproduce)  End of Session PT - End of Session Equipment Utilized During Treatment: Gait belt Activity Tolerance: Patient tolerated treatment well Patient left: in chair;with call bell/phone within reach;with family/visitor present  GP     Shakia Sebastiano 04/21/2012, 12:50 PM  Pager [phone]

## 2012-04-21 NOTE — Progress Notes (Signed)
  Echocardiogram 2D Echocardiogram has been performed.  Lacey Kemp 04/21/2012, 3:08 PM

## 2012-04-21 NOTE — Progress Notes (Signed)
*  PRELIMINARY RESULTS* Vascular Ultrasound Carotid Duplex (Doppler) has been completed.   The left internal carotid artery demonstrates elevated velocities in the mid 60-79% range of stenosis. No obvious evidence of right internal carotid artery stenosis. Bilateral antegrade vertebral artery flow.  04/21/2012 4:56 PM Elpidio Galea, RDMS, RDCS

## 2012-04-22 DIAGNOSIS — E032 Hypothyroidism due to medicaments and other exogenous substances: Secondary | ICD-10-CM

## 2012-04-22 DIAGNOSIS — G459 Transient cerebral ischemic attack, unspecified: Secondary | ICD-10-CM

## 2012-04-22 DIAGNOSIS — R42 Dizziness and giddiness: Secondary | ICD-10-CM

## 2012-04-22 DIAGNOSIS — E782 Mixed hyperlipidemia: Secondary | ICD-10-CM

## 2012-04-22 NOTE — Consult Note (Signed)
Referring Physician:  Dr. Kathlen Mody   Chief Complaint:  Dizziness, nausea, vomiting Charts medications, labs and images reviewed HPI:  This is a 78 y/o RHF with metabolic disorders ( hypothyroidism, DM etc)  was brought in by her daughter for progressive izziness associated with  Gait disturbance, nausea and vomiting.   She received Meclizine in the ED and her symptoms resolved significantly . Her dizziness and nausea improved but not her gait.  Head imaging including MRI of the brain and MRA did not show any new ischemic event in the posterior circulation.  Neurology was consulted for the above symptoms Patient seen at the bedside, she is sitting in a chair, no nausea, no vomiting, she is eating lunch without any dysphagia   She mentioned she had some tinnitus with nausea and vomiting. Also her blood glucose  Is generally stable at home. She denies n/v/ dizziness, no tinnitus, no gait disturbance, no new motor or sensory deficits. She had sinusitis recently    Past Medical History  Diagnosis Date  . Diabetes mellitus   . Breast cancer   . Thyroid disease   . High cholesterol     Past Surgical History  Procedure Date  . Breast lumpectomy     History reviewed. No pertinent family history. Social History:  reports that she has never smoked. She does not have any smokeless tobacco history on file. She reports that she does not drink alcohol or use illicit drugs.  Allergies: No Known Allergies  Medications:  Scheduled:   . aspirin  300 mg Rectal Daily   Or  . aspirin  325 mg Oral Daily  . atorvastatin  10 mg Oral q1800  . enoxaparin  40 mg Subcutaneous Q24H  . insulin aspart  0-5 Units Subcutaneous QHS  . insulin aspart  0-9 Units Subcutaneous TID WC  . levothyroxine  112 mcg Oral Daily    ROS: As mentioned in HPI  Physical Examination: Blood pressure 125/78, pulse 85, temperature 98.5 F (36.9 C), temperature source Oral, resp. rate 20, height 5\' 3"  (1.6 m), weight 71.9 kg  (158 lb 8.2 oz), SpO2 95.00%.  Neurologic Examination: .Mental Status: Alert, oriented, thought content appropriate.  Speech fluent without evidence of aphasia.  Able to follow 3 step commands without difficulty. Cranial Nerves: II: visual fields grossly normal, pupils equal, round, reactive to light and accommodation III,IV, VI: ptosis not present, extra-ocular motions intact bilaterally V,VII: smile symmetric, facial light touch sensation normal bilaterally VIII: hearing normal bilaterally IX,X: gag reflex present XI: trapezius strength/neck flexion strength normal bilaterally XII: tongue strength normal  Motor: Right : Upper extremity   5/5    Left:     Upper extremity   5/5  Lower extremity   5/5     Lower extremity   5/5 Tone and bulk:normal tone throughout; no atrophy noted Sensory: Pinprick and light touch intact throughout, bilaterally Deep Tendon Reflexes: 2+ and symmetric throughout Plantars: Right: downgoing   Left: downgoing Cerebellar: normal finger-to-nose, normal rapid alternating movements and normal heel-to-shin test normal gait and station Rhomberg : negative    Laboratory Studies:  Basic Metabolic Panel:  Lab 04/21/12 1610 04/20/12 2143 04/20/12 1100  NA 139 -- 140  K 4.0 -- 4.2  CL 104 -- 104  CO2 24 -- 22  GLUCOSE 118* -- 80  BUN 16 -- 14  CREATININE 0.91 0.84 0.76  CALCIUM 9.2 -- 9.8  MG -- -- --  PHOS -- -- --    Liver Function  Tests:  Lab 04/20/12 1100  AST 16  ALT 13  ALKPHOS 84  BILITOT 0.2*  PROT 7.4  ALBUMIN 4.0   No results found for this basename: LIPASE:5,AMYLASE:5 in the last 168 hours No results found for this basename: AMMONIA:3 in the last 168 hours  CBC:  Lab 04/20/12 2143 04/20/12 1100  WBC 6.1 7.2  NEUTROABS -- 4.6  HGB 11.3* 12.6  HCT 35.5* 37.8  MCV 84.9 84.9  PLT 222 230    Cardiac Enzymes:  Lab 04/21/12 1323 04/21/12 0625 04/20/12 2143 04/20/12 1100  CKTOTAL 57 58 61 79  CKMB 2.7 2.8 3.0 3.4  CKMBINDEX  -- -- -- --  TROPONINI <0.30 <0.30 <0.30 <0.30    BNP: No components found with this basename: POCBNP:5  CBG:  Lab 04/21/12 2037 04/21/12 1656 04/21/12 1156 04/21/12 0741 04/20/12 2110  GLUCAP 140* 105* 154* 99 192*    Microbiology: No results found for this or any previous visit.  Coagulation Studies: No results found for this basename: LABPROT:5,INR:5 in the last 72 hours  Urinalysis: No results found for this basename: COLORURINE:2,APPERANCEUR:2,LABSPEC:2,PHURINE:2,GLUCOSEU:2,HGBUR:2,BILIRUBINUR:2,KETONESUR:2,PROTEINUR:2,UROBILINOGEN:2,NITRITE:2,LEUKOCYTESUR:2 in the last 168 hours  Lipid Panel:    Component Value Date/Time   CHOL 125 04/21/2012 0625   TRIG 146 04/21/2012 0625   HDL 50 04/21/2012 0625   CHOLHDL 2.5 04/21/2012 0625   VLDL 29 04/21/2012 0625   LDLCALC 46 04/21/2012 0625    HgbA1C:  Lab Results  Component Value Date   HGBA1C 6.8* 04/21/2012    Urine Drug Screen:   No results found for this basename: labopia, cocainscrnur, labbenz, amphetmu, thcu, labbarb    Alcohol Level: No results found for this basename: ETH:2 in the last 168 hours   Imaging: Dg Chest 2 View  04/21/2012  *RADIOLOGY REPORT*  Clinical Data:  78 year old female with dizziness and shortness of breath.  CHEST - 2 VIEW  Comparison: None.  Findings: There is cardiomegaly.  Other mediastinal contours are within normal limits.  Mild elevation of the right hemidiaphragm. No pneumothorax or pulmonary edema.  No pleural effusion or confluent pulmonary opacity.  IMPRESSION: Cardiomegaly. No acute cardiopulmonary abnormality.  Original Report Authenticated By: Harley Hallmark, M.D.   Mr Angiogram Neck W Wo Contrast  04/21/2012  *RADIOLOGY REPORT*  Clinical Data:  Dizziness.  Stroke.  Diabetes and hypertension. Nausea and vomiting.  MRA HEAD WITHOUT CONTRAST  Technique:  Angiographic images of the Circle of Willis were obtained using MRA technique without intravenous contrast.  Comparison:  MRI 06/27   Findings:  Both internal carotid arteries are patent into the brain.  There is atherosclerotic irregularity in the carotid siphon regions.  There is slight ectasia of the supraclinoid internal carotid artery on the right.  No stenosis.  The anterior and middle cerebral vessels are patent bilaterally with moderate atherosclerotic irregularity.  There is smooth narrowing of the proximal left middle cerebral artery estimated at 40% diameter stenosis.  Beyond that, the vessel appears sufficiently patent.  Both vertebral arteries are patent to the basilar.  There is some artifactual distortion of the appearance of the left vertebral artery at the foramen magnum.  This does not represent true pathology.  Both vessels are widely patent to to the basilar.  No basilar stenosis.  Posterior circulation branch vessels are patent without proximal stenosis.  There is a moderate focal stenosis in the left posterior cerebral artery 1.5 cm beyond its origin.  IMPRESSION: Major vessels are patent into the brain.  There is atherosclerotic irregularity in the  siphon regions and supraclinoid internal carotid arteries.  40% stenosis of the left MCA proximally.  Moderate stenosis of the left PCA 1.5 cm beyond its origin.  This is probably on the order of 40% as well.  MRA NECK WITHOUT AND WITH CONTRAST  Technique:  Angiographic images of the neck were obtained using MRA technique without and with intravenous contrast.  Carotid stenosis measurements (when applicable) are obtained utilizing NASCET criteria, using the distal internal carotid diameter as the denominator.  Contrast: 15mL MULTIHANCE GADOBENATE DIMEGLUMINE 529 MG/ML IV SOLN  Findings:  Branching pattern of the brachiocephalic vessels from the arch is normal.  There is mild smooth narrowing of the proximal left common carotid artery with narrowing estimated at 20-30%.  No flow-limiting proximal stenosis.  Both common carotid arteries are widely patent to their respective  bifurcation.  There is smooth atherosclerotic change of both carotid bifurcation regions.  Minimal diameter of the proximal internal carotid arteries bilaterally is 4 mm.  Compared to a more distal cervical ICA diameter of 5 mm, these represent only 20% stenoses.  No external carotid stenoses.  Both vertebral arteries are widely patent.  The vessels are tortuous proximally but there is no origin stenosis.  The left vertebral artery is dominant.  Both vertebral arteries are widely patent through the cervical region.  IMPRESSION: Smooth atherosclerotic plaque affecting both carotid bifurcation regions.  No significant stenosis.  Maximal narrowing is only 20% by NASCET criteria on both sides.  Original Report Authenticated By: Thomasenia Sales, M.D.   Mr Mra Head/brain Wo Cm  04/21/2012  *RADIOLOGY REPORT*  Clinical Data:  Dizziness.  Stroke.  Diabetes and hypertension. Nausea and vomiting.  MRA HEAD WITHOUT CONTRAST  Technique:  Angiographic images of the Circle of Willis were obtained using MRA technique without intravenous contrast.  Comparison:  MRI 06/27  Findings:  Both internal carotid arteries are patent into the brain.  There is atherosclerotic irregularity in the carotid siphon regions.  There is slight ectasia of the supraclinoid internal carotid artery on the right.  No stenosis.  The anterior and middle cerebral vessels are patent bilaterally with moderate atherosclerotic irregularity.  There is smooth narrowing of the proximal left middle cerebral artery estimated at 40% diameter stenosis.  Beyond that, the vessel appears sufficiently patent.  Both vertebral arteries are patent to the basilar.  There is some artifactual distortion of the appearance of the left vertebral artery at the foramen magnum.  This does not represent true pathology.  Both vessels are widely patent to to the basilar.  No basilar stenosis.  Posterior circulation branch vessels are patent without proximal stenosis.  There is a  moderate focal stenosis in the left posterior cerebral artery 1.5 cm beyond its origin.  IMPRESSION: Major vessels are patent into the brain.  There is atherosclerotic irregularity in the siphon regions and supraclinoid internal carotid arteries.  40% stenosis of the left MCA proximally.  Moderate stenosis of the left PCA 1.5 cm beyond its origin.  This is probably on the order of 40% as well.  MRA NECK WITHOUT AND WITH CONTRAST  Technique:  Angiographic images of the neck were obtained using MRA technique without and with intravenous contrast.  Carotid stenosis measurements (when applicable) are obtained utilizing NASCET criteria, using the distal internal carotid diameter as the denominator.  Contrast: 15mL MULTIHANCE GADOBENATE DIMEGLUMINE 529 MG/ML IV SOLN  Findings:  Branching pattern of the brachiocephalic vessels from the arch is normal.  There is mild smooth narrowing of  the proximal left common carotid artery with narrowing estimated at 20-30%.  No flow-limiting proximal stenosis.  Both common carotid arteries are widely patent to their respective bifurcation.  There is smooth atherosclerotic change of both carotid bifurcation regions.  Minimal diameter of the proximal internal carotid arteries bilaterally is 4 mm.  Compared to a more distal cervical ICA diameter of 5 mm, these represent only 20% stenoses.  No external carotid stenoses.  Both vertebral arteries are widely patent.  The vessels are tortuous proximally but there is no origin stenosis.  The left vertebral artery is dominant.  Both vertebral arteries are widely patent through the cervical region.  IMPRESSION: Smooth atherosclerotic plaque affecting both carotid bifurcation regions.  No significant stenosis.  Maximal narrowing is only 20% by NASCET criteria on both sides.  Original Report Authenticated By: Thomasenia Sales, M.D.    Assessment:  78 y/o female with stroke risk factors present with posterior circulation problems. With tinnitus,  sinusitis, nausea, vomiting, balance issues rather than pure motor ataxia.  She received fluids and has been stable.  Back to baseline   Differential diagnosis: 1) Labyrinthitis 2) Dehydration 3) posterior circulation compromise   Recommendations: Patient is back to baseline, advised to continue with the home meds and management of diabetes  Will sign off     Chidiebere Wynn V-P Eilleen Kempf., MD., Ph.D., MS  04/22/2012, 12:01 PM

## 2012-04-24 LAB — GLUCOSE, CAPILLARY: Glucose-Capillary: 125 mg/dL — ABNORMAL HIGH (ref 70–99)

## 2012-05-02 NOTE — Discharge Summary (Signed)
Physician Discharge Summary  Lacey Kemp VHQ:[id number] DOB: February 07, 1931 DOA: 04/20/2012  PCP: Provider Not In System  Admit date: 04/20/2012 Discharge date: 05/02/2012  Recommendations for Outpatient Follow-up:  1. Follow up pcp as recommended  Discharge Diagnoses:  Active Problems: 1. TIA 2.DIABETES MELLITUS 3. HYPERLIPIDEMIA 4. HYPOTHYROIDISM 5. DIZZINESS 6. DEHYDRATION  Discharge Condition: STABLE  Diet recommendation: carb modified diet  History of present illness:  78 year old lady with h/o DM, hypothyroidism was brought in by her daughter for an episode of dizziness associated with nausea and vomiting. She was unsteady on her feet. On arrival to ED, she was given meclizine . Her dizziness and nausea improved. But when she tried to walk, she was unsteady and was swaying to her right side. She was admitted for evaluation of TIA.    Hospital Course:  78 year old lady admitted for nausea, episode of vomiting, dizziness. She had a ct head done in ED, and it was abnormal with a low attenuation area in the left frontal lobe, and  She was admitted for evaluation of the TIA. She underwent a MRI of the brain, but accurate enhancing of the images were not possible, as due to an error with the injector. She also underwent MRA of the head and neck,  Shows 40% stenosis of the left MCA proximally.  She had a carotid duplex done showing elevated velocities in the mid 60-79% range of stenosis. No obvious evidence of right internal carotid artery stenosis. Bilateral antegrade vertebral artery flow. Echo was not significant.  He lipids were within normal limits. And hgba1c  Is 6.8%. Neurology consult was obtained, recommended no further workup   Hypothyroidism: continue with synthroid  Diabetes mellitus: hgba1c is 6.8% continue with home medications  Dehydration: her symptoms of dizziness and nausea could be from dehydration. She was gently hydrated. And her symptoms resolved.       Procedures:  CT HEAD  MRI BRAIN  MRA HEAD AND NECK  Consultations:  Neurology   Discharge Exam: Filed Vitals:   04/22/12 1200  BP: 139/79  Pulse: 74  Temp: 98.1 F (36.7 C)  Resp: 18   Filed Vitals:   04/22/12 0000 04/22/12 0400 04/22/12 0800 04/22/12 1200  BP: 144/80 115/72 125/78 139/79  Pulse: 78 75 85 74  Temp: 98 F (36.7 C) 98.3 F (36.8 C) 98.5 F (36.9 C) 98.1 F (36.7 C)  TempSrc: Oral Oral    Resp: 18 18 20 18   Height:      Weight:      SpO2: 96% 95% 95% 98%   General: Alert afebrile comfortable  Cardiovascular: s1s2 RRR  Respiratory: CTAB, No wheezing or rhonchi.  Abdomen: soft NT ND BS +  Extremities; no pedal edema Neuro: no focal deficits. Able to walk without any restrictions. Discharge Instructions  Discharge Orders    Future Orders Please Complete By Expires   Diet - low sodium heart healthy      Discharge instructions      Comments:   Follow up with pcp as recommended.   Activity as tolerated - No restrictions        Medication List  As of 05/02/2012  1:04 PM   TAKE these medications         aspirin 81 MG tablet   Take 81 mg by mouth daily.      glipiZIDE 5 MG 24 hr tablet   Commonly known as: GLUCOTROL XL   Take 5 mg by mouth daily.  levothyroxine 112 MCG tablet   Commonly known as: SYNTHROID, LEVOTHROID   Take 112 mcg by mouth daily.      metFORMIN 1000 MG tablet   Commonly known as: GLUCOPHAGE   Take 1,000 mg by mouth 2 (two) times daily with a meal.      rosuvastatin 5 MG tablet   Commonly known as: CRESTOR   Take 5 mg by mouth daily.      VICTOZA 18 MG/3ML Soln   Generic drug: Liraglutide   Inject 1.2 mg into the skin every morning.           Follow-up Information    Follow up with [facility]. (Please call the number above to assist with finding a PCP. You can also call the toll free number on your insurance card for a list of providers or check the  [employer]. )    Contact information:    [phone]          The results of significant diagnostics from this hospitalization (including imaging, microbiology, ancillary and laboratory) are listed below for reference.    Significant Diagnostic Studies: Dg Chest 2 View  04/21/2012  *RADIOLOGY REPORT*  Clinical Data:  78 year old female with dizziness and shortness of breath.  CHEST - 2 VIEW  Comparison: None.  Findings: There is cardiomegaly.  Other mediastinal contours are within normal limits.  Mild elevation of the right hemidiaphragm. No pneumothorax or pulmonary edema.  No pleural effusion or confluent pulmonary opacity.  IMPRESSION: Cardiomegaly. No acute cardiopulmonary abnormality.  Original Report Authenticated By: Harley Hallmark, M.D.   Ct Head Wo Contrast  04/20/2012  *RADIOLOGY REPORT*  Clinical Data: Dizziness.  Emesis.  CT HEAD WITHOUT CONTRAST  Technique:  Contiguous axial images were obtained from the base of the skull through the vertex without contrast.  Comparison: No priors.  Findings: Images 23 and 24 of series 2 demonstrate an area of low attenuation in the left frontal lobe white matter, favored to represent sequela of prior infarction (however, the margins are slightly indistinct).  Cerebral and cerebellar atrophy is present, and is age appropriate.  In addition, there are patchy and confluent areas of decreased attenuation throughout the deep and periventricular white matter of the cerebral hemispheres bilaterally, compatible with chronic microvascular ischemic changes.  No definite region of acute/subacute cerebral ischemia, no acute intracranial hemorrhage, no focal mass, mass effect, hydrocephalus or abnormal intra or extra-axial fluid collections. No acute displaced skull fractures are identified.  Visualized paranasal sinuses and mastoids are well pneumatized.  IMPRESSION: 1.  No definite acute intracranial abnormalities. There is, however, one well defined area of low attenuation has slight indistinctness of  its margins in the anterior left frontal lobe white matter which could conceivably represent an area of evolving subacute ischemia, although this is more strongly favored to represent sequela of remote prior infarction.  This would require MRI for better discrimination if clinically indicated. 2.  Cerebral and cerebellar atrophy (age appropriate) with chronic microvascular ischemic changes in the cerebral white matter, as above.  Original Report Authenticated By: Florencia Reasons, M.D.   Mr Angiogram Neck W Wo Contrast  04/21/2012  *RADIOLOGY REPORT*  Clinical Data:  Dizziness.  Stroke.  Diabetes and hypertension. Nausea and vomiting.  MRA HEAD WITHOUT CONTRAST  Technique:  Angiographic images of the Circle of Willis were obtained using MRA technique without intravenous contrast.  Comparison:  MRI 06/27  Findings:  Both internal carotid arteries are patent into the brain.  There  is atherosclerotic irregularity in the carotid siphon regions.  There is slight ectasia of the supraclinoid internal carotid artery on the right.  No stenosis.  The anterior and middle cerebral vessels are patent bilaterally with moderate atherosclerotic irregularity.  There is smooth narrowing of the proximal left middle cerebral artery estimated at 40% diameter stenosis.  Beyond that, the vessel appears sufficiently patent.  Both vertebral arteries are patent to the basilar.  There is some artifactual distortion of the appearance of the left vertebral artery at the foramen magnum.  This does not represent true pathology.  Both vessels are widely patent to to the basilar.  No basilar stenosis.  Posterior circulation branch vessels are patent without proximal stenosis.  There is a moderate focal stenosis in the left posterior cerebral artery 1.5 cm beyond its origin.  IMPRESSION: Major vessels are patent into the brain.  There is atherosclerotic irregularity in the siphon regions and supraclinoid internal carotid arteries.  40% stenosis  of the left MCA proximally.  Moderate stenosis of the left PCA 1.5 cm beyond its origin.  This is probably on the order of 40% as well.  MRA NECK WITHOUT AND WITH CONTRAST  Technique:  Angiographic images of the neck were obtained using MRA technique without and with intravenous contrast.  Carotid stenosis measurements (when applicable) are obtained utilizing NASCET criteria, using the distal internal carotid diameter as the denominator.  Contrast: 15mL MULTIHANCE GADOBENATE DIMEGLUMINE 529 MG/ML IV SOLN  Findings:  Branching pattern of the brachiocephalic vessels from the arch is normal.  There is mild smooth narrowing of the proximal left common carotid artery with narrowing estimated at 20-30%.  No flow-limiting proximal stenosis.  Both common carotid arteries are widely patent to their respective bifurcation.  There is smooth atherosclerotic change of both carotid bifurcation regions.  Minimal diameter of the proximal internal carotid arteries bilaterally is 4 mm.  Compared to a more distal cervical ICA diameter of 5 mm, these represent only 20% stenoses.  No external carotid stenoses.  Both vertebral arteries are widely patent.  The vessels are tortuous proximally but there is no origin stenosis.  The left vertebral artery is dominant.  Both vertebral arteries are widely patent through the cervical region.  IMPRESSION: Smooth atherosclerotic plaque affecting both carotid bifurcation regions.  No significant stenosis.  Maximal narrowing is only 20% by NASCET criteria on both sides.  Original Report Authenticated By: Thomasenia Sales, M.D.   Mr Laqueta Jean Contrast  05/02/2012  **ADDENDUM** CREATED: 05/01/2012 10:54:58  Corrected exam description:  MRI of the brain with contrast  The patient received intravenous contrast at the beginning of the study and unenhanced images were not obtained due to an error  with the injector.  **END ADDENDUM** SIGNED BY: Dineen Kid. Chestine Spore, M.D.   04/20/2012  *RADIOLOGY REPORT*  Clinical  Data: Dizziness and vomiting.  MRI HEAD WITHOUT AND WITH CONTRAST  Technique:  Multiplanar, multiecho pulse sequences of the brain and surrounding structures were obtained according to standard protocol without and with intravenous contrast  Contrast:  15 ml Multihance IV.  The patient received contrast at the beginning of the study due to an injector error.  Comparison: CT 04/20/2012  Findings: Negative for acute infarct.  Generalized moderate atrophy.  Mild to moderate chronic microvascular ischemic changes in the white matter.  Brainstem and cerebellum are intact.  Negative for hemorrhage.  Negative for mass or edema.  Normal enhancement pattern with post contrast infusion.  Paranasal sinuses are clear.  IMPRESSION:  Atrophy and chronic microvascular ischemia in the cerebral white matter.  No acute infarct.  Original Report Authenticated By: Camelia Phenes, M.D.   Mr Mra Head/brain Wo Cm  04/21/2012  *RADIOLOGY REPORT*  Clinical Data:  Dizziness.  Stroke.  Diabetes and hypertension. Nausea and vomiting.  MRA HEAD WITHOUT CONTRAST  Technique:  Angiographic images of the Circle of Willis were obtained using MRA technique without intravenous contrast.  Comparison:  MRI 06/27  Findings:  Both internal carotid arteries are patent into the brain.  There is atherosclerotic irregularity in the carotid siphon regions.  There is slight ectasia of the supraclinoid internal carotid artery on the right.  No stenosis.  The anterior and middle cerebral vessels are patent bilaterally with moderate atherosclerotic irregularity.  There is smooth narrowing of the proximal left middle cerebral artery estimated at 40% diameter stenosis.  Beyond that, the vessel appears sufficiently patent.  Both vertebral arteries are patent to the basilar.  There is some artifactual distortion of the appearance of the left vertebral artery at the foramen magnum.  This does not represent true pathology.  Both vessels are widely patent to to the  basilar.  No basilar stenosis.  Posterior circulation branch vessels are patent without proximal stenosis.  There is a moderate focal stenosis in the left posterior cerebral artery 1.5 cm beyond its origin.  IMPRESSION: Major vessels are patent into the brain.  There is atherosclerotic irregularity in the siphon regions and supraclinoid internal carotid arteries.  40% stenosis of the left MCA proximally.  Moderate stenosis of the left PCA 1.5 cm beyond its origin.  This is probably on the order of 40% as well.  MRA NECK WITHOUT AND WITH CONTRAST  Technique:  Angiographic images of the neck were obtained using MRA technique without and with intravenous contrast.  Carotid stenosis measurements (when applicable) are obtained utilizing NASCET criteria, using the distal internal carotid diameter as the denominator.  Contrast: 15mL MULTIHANCE GADOBENATE DIMEGLUMINE 529 MG/ML IV SOLN  Findings:  Branching pattern of the brachiocephalic vessels from the arch is normal.  There is mild smooth narrowing of the proximal left common carotid artery with narrowing estimated at 20-30%.  No flow-limiting proximal stenosis.  Both common carotid arteries are widely patent to their respective bifurcation.  There is smooth atherosclerotic change of both carotid bifurcation regions.  Minimal diameter of the proximal internal carotid arteries bilaterally is 4 mm.  Compared to a more distal cervical ICA diameter of 5 mm, these represent only 20% stenoses.  No external carotid stenoses.  Both vertebral arteries are widely patent.  The vessels are tortuous proximally but there is no origin stenosis.  The left vertebral artery is dominant.  Both vertebral arteries are widely patent through the cervical region.  IMPRESSION: Smooth atherosclerotic plaque affecting both carotid bifurcation regions.  No significant stenosis.  Maximal narrowing is only 20% by NASCET criteria on both sides.  Original Report Authenticated By: Thomasenia Sales, M.D.     Microbiology: No results found for this or any previous visit (from the past 240 hour(s)).   Labs: Basic Metabolic Panel: No results found for this basename: NA:5,K:5,CL:5,CO2:5,GLUCOSE:5,BUN:5,CREATININE:5,CALCIUM:5,MG:5,PHOS:5 in the last 168 hours Liver Function Tests: No results found for this basename: AST:5,ALT:5,ALKPHOS:5,BILITOT:5,PROT:5,ALBUMIN:5 in the last 168 hours No results found for this basename: LIPASE:5,AMYLASE:5 in the last 168 hours No results found for this basename: AMMONIA:5 in the last 168 hours CBC: No results found for this basename: WBC:5,NEUTROABS:5,HGB:5,HCT:5,MCV:5,PLT:5 in the last 168 hours Cardiac Enzymes: No  results found for this basename: CKTOTAL:5,CKMB:5,CKMBINDEX:5,TROPONINI:5 in the last 168 hours BNP: BNP (last 3 results) No results found for this basename: PROBNP:3 in the last 8760 hours CBG: No results found for this basename: GLUCAP:5 in the last 168 hours  Time coordinating discharge: 45 minutes  Signed:  Lezly Rumpf  [facility] 05/02/2012, 1:04 PM

## 2015-08-08 ENCOUNTER — Encounter: Payer: Self-pay | Admitting: Internal Medicine

## 2015-08-08 ENCOUNTER — Ambulatory Visit (INDEPENDENT_AMBULATORY_CARE_PROVIDER_SITE_OTHER): Payer: Medicare Other | Admitting: Internal Medicine

## 2015-08-08 VITALS — BP 126/80 | HR 52 | Temp 97.6°F | Resp 18 | Ht 63.0 in | Wt 154.0 lb

## 2015-08-08 DIAGNOSIS — M810 Age-related osteoporosis without current pathological fracture: Secondary | ICD-10-CM | POA: Diagnosis not present

## 2015-08-08 DIAGNOSIS — I2510 Atherosclerotic heart disease of native coronary artery without angina pectoris: Secondary | ICD-10-CM | POA: Insufficient documentation

## 2015-08-08 DIAGNOSIS — E039 Hypothyroidism, unspecified: Secondary | ICD-10-CM

## 2015-08-08 DIAGNOSIS — Z955 Presence of coronary angioplasty implant and graft: Secondary | ICD-10-CM | POA: Insufficient documentation

## 2015-08-08 DIAGNOSIS — E119 Type 2 diabetes mellitus without complications: Secondary | ICD-10-CM

## 2015-08-08 DIAGNOSIS — E7800 Pure hypercholesterolemia, unspecified: Secondary | ICD-10-CM

## 2015-08-08 NOTE — Patient Instructions (Signed)
  Test(s) ordered today. Your results will be released to [place] (or called to you) after review, usually within 72hours after test completion. If any changes need to be made, you will be notified at that same time.  No immunizations administered today.  You should have a flu vaccine.   Medications reviewed and updated.  No changes recommended at this time.  A referral was ordered cardiology.  Please schedule followup in about 3 months.

## 2015-08-08 NOTE — Progress Notes (Signed)
Pre visit review using our clinic review tool, if applicable. No additional management support is needed unless otherwise documented below in the visit note. 

## 2015-08-08 NOTE — Progress Notes (Signed)
Subjective:    Patient ID: Lacey Kemp, female    DOB: 1930/11/01, 81 y.o.   MRN: [id number]  HPI She is here to establish with a new pcp.  She lives with her daughter her, but also lives with her other two daughters in different cities for part of the year.  She is living in this area more and wanted to establish with a pcp here.  She has a pcp in [facility].    Diabetes: She is taking her medication daily as prescribed. She is compliant with a diabetic diet. She is active, minimal exercise - dog walking, gardening. She monitors her sugars and they have been running 140's, she has had occasional 200's if she eats sweets, but does not do that often. She checks her feet daily and denies foot lesions. She is up-to-date with an ophthalmology examination. She had blood work done and thinks her sugars were controlled.  She is unsure when the blood work was done and what her numbers were.  Hyperlipidemia: She is taking her medication daily. She is compliant with a low fat/cholesterol diet. She is exercising minimally.   Hypothyroidism:  She is taking all of her medications as prescribed.  She feels her energy level is good.   CAD s/p RCA stent about 6 months ago:  She started having heartburn that did not go away with Tums about  6 months ago.  The pain persisted and she had jaw pain as well.  She went to the ED and was told she did have a small heart attack.  She had a stent put in her RCA.  She has not had any similar symptoms since.   She was supposed to follow up with a cardiologist, but was in this area and missed the appointment.  She was wondering if she needed to see a cardiologist.  She denies chest pain, sob with activity, edema and light headedness.   Medications and allergies reviewed with patient and updated if appropriate.  Patient Active Problem List   Diagnosis Date Noted  . Diabetes ([place]) 08/08/2015  . Hypothyroidism 08/08/2015  . CAD (coronary artery disease) 08/08/2015  . S/P  right coronary artery (RCA) stent placement 08/08/2015  . Osteoporosis 08/08/2015  . High cholesterol     Past Medical History  Diagnosis Date  . Diabetes mellitus   . Thyroid disease   . High cholesterol   . Breast cancer [facility])     Past Surgical History  Procedure Laterality Date  . Breast lumpectomy    . Stent implant  [id number]    Social History   Social History  . Marital Status: Widowed    Spouse Name: N/A  . Number of Children: N/A  . Years of Education: N/A   Social History Main Topics  . Smoking status: Never Smoker   . Smokeless tobacco: None  . Alcohol Use: No  . Drug Use: No  . Sexual Activity: No   Other Topics Concern  . None   Social History Narrative    Review of Systems  Constitutional: Negative for fever, appetite change and fatigue.  Respiratory: Negative for cough, shortness of breath and wheezing.   Cardiovascular: Negative for chest pain, palpitations and leg swelling.  Gastrointestinal: Negative for nausea, abdominal pain, diarrhea, constipation and blood in stool.       Occasional GERD  Genitourinary: Positive for frequency. Negative for dysuria and difficulty urinating.       Incontinence  Musculoskeletal: Positive for back pain (occasional).  Negative for arthralgias.  Skin:       Occasional hives - ? cause  Neurological: Positive for numbness (numbness/tingling in feet at night, occasional in hands) and headaches (rare). Negative for dizziness and light-headedness.  Hematological: Bruises/bleeds easily.  Psychiatric/Behavioral: Negative for dysphoric mood. The patient is not nervous/anxious.        Objective:   Filed Vitals:   08/08/15 1329  BP: 126/80  Pulse: 52  Temp: 97.6 F (36.4 C)  Resp: 18   Filed Weights   08/08/15 1329  Weight: 154 lb (69.854 kg)   Body mass index is 27.29 kg/(m^2).   Physical Exam  Constitutional: She is oriented to person, place, and time. She appears well-developed and well-nourished. No  distress.  HENT:  Head: Normocephalic and atraumatic.  Right Ear: External ear normal.  Left Ear: External ear normal.  Mouth/Throat: Oropharynx is clear and moist.  Eyes: Conjunctivae are normal.  Neck: Neck supple. No JVD present. No tracheal deviation present. No thyromegaly present.  No carotid bruit  Cardiovascular: Normal rate, regular rhythm and normal heart sounds.   No murmur heard. Pulmonary/Chest: Effort normal and breath sounds normal. No respiratory distress. She has no wheezes.  Abdominal: Soft. She exhibits no distension. There is no tenderness.  Musculoskeletal: She exhibits no edema.  Lymphadenopathy:    She has no cervical adenopathy.  Neurological: She is alert and oriented to person, place, and time.  Skin: Skin is warm and dry. No rash noted.  Psychiatric: She has a normal mood and affect. Her behavior is normal. Thought content normal.        Assessment & Plan:   See Problem List.  She will have her records transferred  Blood work ordered, which she will get done soon  Follow up in about 3 months

## 2015-08-10 NOTE — Assessment & Plan Note (Signed)
Not currently taking vitamin d - will check vitamin d level  Consider dexa at next visit depending on when last one was Encouraged regular exercise

## 2015-08-10 NOTE — Assessment & Plan Note (Signed)
Will check tsh and adjust medication dose if needed

## 2015-08-10 NOTE — Assessment & Plan Note (Signed)
Check a1c Continue frequent monitoring of sugars Continue regular activity/exercise Continue current medications Has an eye appt scheduled

## 2015-08-10 NOTE — Assessment & Plan Note (Signed)
6 months ago had a small MI and RCA stent placed, doing well since Will refer to cardiology - likely needs an echo or stress test to evaluate heart function Continue current medications Check blood work

## 2015-08-10 NOTE — Assessment & Plan Note (Signed)
Check lipid panel, cmp Continue crestor 5 mg daily

## 2015-10-08 ENCOUNTER — Ambulatory Visit: Payer: Medicare Other | Admitting: Cardiology

## 2015-11-04 ENCOUNTER — Ambulatory Visit (INDEPENDENT_AMBULATORY_CARE_PROVIDER_SITE_OTHER): Payer: Medicare Other | Admitting: Cardiology

## 2015-11-04 ENCOUNTER — Encounter: Payer: Self-pay | Admitting: Cardiology

## 2015-11-04 VITALS — BP 110/70 | HR 57 | Ht 63.0 in | Wt 156.0 lb

## 2015-11-04 DIAGNOSIS — I252 Old myocardial infarction: Secondary | ICD-10-CM | POA: Diagnosis not present

## 2015-11-04 DIAGNOSIS — I2583 Coronary atherosclerosis due to lipid rich plaque: Principal | ICD-10-CM

## 2015-11-04 DIAGNOSIS — I2510 Atherosclerotic heart disease of native coronary artery without angina pectoris: Secondary | ICD-10-CM | POA: Diagnosis not present

## 2015-11-04 DIAGNOSIS — E785 Hyperlipidemia, unspecified: Secondary | ICD-10-CM | POA: Diagnosis not present

## 2015-11-04 NOTE — Addendum Note (Signed)
Addended by: Fransico Him R on: 11/04/2015 09:57 PM   Modules accepted: Level of Service, SmartSet

## 2015-11-04 NOTE — Progress Notes (Signed)
Cardiology Office Note   Date:  11/04/2015   ID:  Lacey Kemp, DOB 1931-01-03, MRN [id number]  PCP:  Binnie Rail, MD    Chief Complaint  Patient presents with  . Coronary Artery Disease  . Hyperlipidemia      History of Present Illness: Lacey Kemp is a 82 y.o. 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  She lives with her daughter here but also lives with her other 2 daughters in other states part of the year.  She is now living in this area more and want to establish care here.  She has a history of ASCAD with NSTEMI with stenosis of the RCA stent about 9 months ago in [facility]. Her symptoms at that time were heartburn that radiated into her jaw.  She has not had any symptoms since her MI.  She also has a history of DM, dyslipidemia and hypothyroidism.  She denies any chest pain of indigestion, SOB, DOE, LE edema, dizziness, palpitations or syncope.    Past Medical History  Diagnosis Date  . Diabetes mellitus   . Thyroid disease   . High cholesterol   . Breast cancer ([place])   . Dyslipidemia, goal LDL below 70   . 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 Coronary artery disease     s/p NSTEMI with PCI of RCA    Past Surgical History  Procedure Laterality Date  . Breast lumpectomy    . Stent implant  [id number]  . Cardiac catheterization    . Coronary angioplasty      PCI of RCA     Current Outpatient Prescriptions  Medication Sig Dispense Refill  . aspirin 81 MG tablet Take 325 mg by mouth daily.     . Canagliflozin-Metformin HCl (INVOKAMET) 50-1000 MG TABS Take by mouth daily.    . clopidogrel (PLAVIX) 75 MG tablet Take 75 mg by mouth daily.    Marland Kitchen levothyroxine (SYNTHROID, LEVOTHROID) 88 MCG tablet Take 88 mcg by mouth daily before breakfast.    . metoprolol succinate (TOPROL-XL) 50 MG 24 hr tablet Take 50 mg by mouth daily. Take with or immediately following a meal.    . rosuvastatin (CRESTOR) 5 MG tablet Take 5 mg by mouth daily.    . sitaGLIPtin  (JANUVIA) 50 MG tablet Take 50 mg by mouth daily.     No current facility-administered medications for this visit.    Allergies:   Review of patient's allergies indicates no known allergies.    Social History:  The patient  reports that she has quit smoking. She does not have any smokeless tobacco history on file. She reports that she does not drink alcohol or use illicit drugs.   Family History:  The patient's family history includes Arrhythmia in her brother; Breast cancer in her sister; Emphysema in her mother; Prostate cancer in her father.    ROS:  Please see the history of present illness.   Otherwise, review of systems are positive for none.   All other systems are reviewed and negative.    PHYSICAL EXAM: VS:  BP 110/70 mmHg  Pulse 57  Ht 5\' 3"  (1.6 m)  Wt 156 lb (70.761 kg)  BMI 27.64 kg/m2  SpO2 98% , BMI Body mass index is 27.64 kg/(m^2). GEN: Well nourished, well developed, in no acute distress HEENT: normal Neck: no JVD, carotid bruits, or masses Cardiac: RRR; no murmurs, rubs, or gallops,no  edema  Respiratory:  clear to auscultation bilaterally, normal work of breathing GI: soft, nontender, nondistended, + BS MS: no deformity or atrophy Skin: warm and dry, no rash Neuro:  Strength and sensation are intact Psych: euthymic mood, full affect   EKG:  EKG is not ordered today.    Recent Labs: No results found for requested labs within last 365 days.    Lipid Panel    Component Value Date/Time   CHOL 125 04/21/2012 0625   TRIG 146 04/21/2012 0625   HDL 50 04/21/2012 0625   CHOLHDL 2.5 04/21/2012 0625   VLDL 29 04/21/2012 0625   LDLCALC 46 04/21/2012 0625      Wt Readings from Last 3 Encounters:  11/04/15 156 lb (70.761 kg)  08/08/15 154 lb (69.854 kg)  04/20/12 158 lb 8.2 oz (71.9 kg)       ASSESSMENT AND PLAN:  1.  ASCAD with NSTEMI 9 months ago s/p DES to the RCA.  She has no angina.  I will get a copy of her cath from [facility].  She will continue  on ASA/Plavix/statin/BB. 2.  Dyslipidemia - continue statin.  I will get records from PCP of last FLP. 3.  DM - per PCP   Current medicines are reviewed at length with the patient today.  The patient does not have concerns regarding medicines.  The following changes have been made:  no change  Labs/ tests ordered today: See above Assessment and Plan No orders of the defined types were placed in this encounter.     Disposition:   FU with me in 6 months  Signed, Sueanne Margarita, MD  11/04/2015 10:11 AM    [facility] [address], [place], [place]  [address] Phone: [phone]; Fax: [phone]

## 2015-11-04 NOTE — Patient Instructions (Signed)

## 2015-12-03 ENCOUNTER — Telehealth: Payer: Self-pay | Admitting: Cardiology

## 2015-12-03 NOTE — Telephone Encounter (Signed)
Records rec Via mail from Dr. Gwenette Greet Office placed in chart prep. For Dr.Turner

## 2015-12-08 ENCOUNTER — Encounter: Payer: Self-pay | Admitting: Cardiology

## 2015-12-08 DIAGNOSIS — C50919 Malignant neoplasm of unspecified site of unspecified female breast: Secondary | ICD-10-CM

## 2015-12-08 HISTORY — DX: Malignant neoplasm of unspecified site of unspecified female breast: C50.919

## 2016-02-23 ENCOUNTER — Ambulatory Visit: Payer: Medicare Other | Admitting: Physician Assistant

## 2016-02-29 NOTE — Addendum Note (Signed)
Addended by: Fransico Him R on: 02/29/2016 09:26 PM   Modules accepted: Miquel Dunn

## 2016-03-10 ENCOUNTER — Encounter: Payer: Self-pay | Admitting: Physician Assistant

## 2018-01-11 ENCOUNTER — Encounter: Payer: Self-pay | Admitting: Cardiology

## 2018-01-19 ENCOUNTER — Ambulatory Visit: Payer: Medicare Other | Admitting: Cardiology

## 2018-03-09 ENCOUNTER — Encounter: Payer: Self-pay | Admitting: *Deleted

## 2018-03-09 ENCOUNTER — Ambulatory Visit (INDEPENDENT_AMBULATORY_CARE_PROVIDER_SITE_OTHER): Payer: Medicare Other | Admitting: Cardiology

## 2018-03-09 ENCOUNTER — Encounter: Payer: Self-pay | Admitting: Cardiology

## 2018-03-09 VITALS — BP 140/68 | HR 61 | Ht 63.0 in | Wt 146.4 lb

## 2018-03-09 DIAGNOSIS — E785 Hyperlipidemia, unspecified: Secondary | ICD-10-CM | POA: Diagnosis not present

## 2018-03-09 DIAGNOSIS — I2510 Atherosclerotic heart disease of native coronary artery without angina pectoris: Secondary | ICD-10-CM

## 2018-03-09 DIAGNOSIS — I252 Old myocardial infarction: Secondary | ICD-10-CM

## 2018-03-09 LAB — CBC
HEMATOCRIT: 37.9 % (ref 34.0–46.6)
HEMOGLOBIN: 12.6 g/dL (ref 11.1–15.9)
MCH: 27 pg (ref 26.6–33.0)
MCHC: 33.2 g/dL (ref 31.5–35.7)
MCV: 81 fL (ref 79–97)
Platelets: 256 10*3/uL (ref 150–379)
RBC: 4.67 x10E6/uL (ref 3.77–5.28)
RDW: 15.4 % (ref 12.3–15.4)
WBC: 6.6 10*3/uL (ref 3.4–10.8)

## 2018-03-09 LAB — BASIC METABOLIC PANEL
BUN / CREAT RATIO: 14 (ref 12–28)
BUN: 12 mg/dL (ref 8–27)
CHLORIDE: 101 mmol/L (ref 96–106)
CO2: 22 mmol/L (ref 20–29)
Calcium: 9.8 mg/dL (ref 8.7–10.3)
Creatinine, Ser: 0.88 mg/dL (ref 0.57–1.00)
GFR calc Af Amer: 70 mL/min/{1.73_m2} (ref >59)
GFR calc non Af Amer: 61 mL/min/{1.73_m2} (ref >59)
GLUCOSE: 151 mg/dL — AB (ref 65–99)
Potassium: 4.4 mmol/L (ref 3.5–5.2)
SODIUM: 138 mmol/L (ref 134–144)

## 2018-03-09 LAB — TSH: TSH: 1.31 u[IU]/mL (ref 0.450–4.500)

## 2018-03-09 NOTE — Patient Instructions (Signed)
Medication Instructions:  Your physician recommends that you continue on your current medications as directed. Please refer to the Current Medication list given to you today.   Labwork: Your physician recommends that you return for lab work in: today (CBC, BMET, TSH)   Testing/Procedures: Your physician has requested that you have an echocardiogram. Echocardiography is a painless test that uses sound waves to create images of your heart. It provides your doctor with information about the size and shape of your heart and how well your heart's chambers and valves are working. This procedure takes approximately one hour. There are no restrictions for this procedure.  Your physician has requested that you have a lexiscan myoview. For further information please visit [email or web address]. Please follow instruction sheet, as given.    Follow-Up: Your physician wants you to follow-up in: [place]. You will receive a reminder letter in the mail two months in advance. If you don't receive a letter, please call our office to schedule the follow-up appointment.   Any Other Special Instructions Will Be Listed Below (If Applicable).     If you need a refill on your cardiac medications before your next appointment, please call your pharmacy.

## 2018-03-09 NOTE — Progress Notes (Signed)
Cardiology Office Note:    Date:  03/09/2018   ID:  Lacey Kemp, DOB [birth year], MRN [id number]  PCP:  Lacey Rail, MD  Cardiologist:  No primary care provider on file.    Referring MD: Lacey Rail, MD   Chief Complaint  Patient presents with  . Coronary Artery Disease  . Hyperlipidemia    History of Present Illness:    Lacey Kemp is a 84 y.o. female with a hx of ASCAD with NSTEMI with stenosis of the RCA stent in [place]. Her symptoms at that time were heartburn that radiated into her jaw. She also has a history of DM, dyslipidemia and hypothyroidism.  She is here today for followup and is doing well.  Her cardiologist in [place] stopped her beta-blocker the last time she saw them likely related to bradycardia.  She denies any chest pain or pressure or her anginal equivalent of chest burning.  She states that she has had some increased dyspnea on exertion which she thinks is a chronic problem but her daughter is concerned it may have become worse recently.  She is also been complaining of significant fatigue over the past month or so that has concerned her daughter as well.  She denies any PND, orthopnea, LE edema, dizziness, palpitations or syncope. She is compliant with her meds and is tolerating meds with no SE.    Past Medical History:  Diagnosis Date  . Breast CA ([place]) 12/08/2015   S/p lumpectomy left breast  . Coronary artery disease    s/p NSTEMI with PCI of RCA  . Diabetes mellitus   . Dyslipidemia, goal LDL below 70   . Thyroid disease     Past Surgical History:  Procedure Laterality Date  . BREAST LUMPECTOMY    . CARDIAC CATHETERIZATION    . CORONARY ANGIOPLASTY     PCI of RCA  . stent implant  [id number]    Current Medications: Current Meds  Medication Sig  . aspirin 81 MG tablet Take 325 mg by mouth daily.   . clopidogrel (PLAVIX) 75 MG tablet Take 75 mg by mouth daily.  . Empagliflozin-metFORMIN HCl ER (SYNJARDY XR) 25-1000 MG TB24 Take  25-1,000 mg by mouth daily.  Marland Kitchen levothyroxine (SYNTHROID, LEVOTHROID) 88 MCG tablet Take 88 mcg by mouth daily before breakfast.  . rosuvastatin (CRESTOR) 5 MG tablet Take 5 mg by mouth daily.  . sitaGLIPtin (JANUVIA) 50 MG tablet Take 50 mg by mouth daily.     Allergies:   Patient has no known allergies.   Social History   Socioeconomic History  . Marital status: Widowed    Spouse name: Not on file  . Number of children: Not on file  . Years of education: Not on file  . Highest education level: Not on file  Occupational History  . Not on file  Social Needs  . Financial resource strain: Not on file  . Food insecurity:    Worry: Not on file    Inability: Not on file  . Transportation needs:    Medical: Not on file    Non-medical: Not on file  Tobacco Use  . Smoking status: Former Research scientist (life sciences)  . Smokeless tobacco: Never Used  Substance and Sexual Activity  . Alcohol use: No  . Drug use: No  . Sexual activity: Never  Lifestyle  . Physical activity:    Days per week: Not on file    Minutes per session: Not on file  . Stress: Not on file  Relationships  . Social connections:    Talks on phone: Not on file    Gets together: Not on file    Attends religious service: Not on file    Active member of club or organization: Not on file    Attends meetings of clubs or organizations: Not on file    Relationship status: Not on file  Other Topics Concern  . Not on file  Social History Narrative  . Not on file     Family History: The patient's family history includes Arrhythmia in her brother; Breast cancer in her sister; Emphysema in her mother; Prostate cancer in her father.  ROS:   Please see the history of present illness.    ROS  All other systems reviewed and negative.   EKGs/Labs/Other Studies Reviewed:    The following studies were reviewed today: none  EKG:  EKG is  ordered today.  The ekg ordered today demonstrates NSR with no ST changes at 61bpm  Recent  Labs: No results found for requested labs within last 8760 hours.   Recent Lipid Panel    Component Value Date/Time   CHOL 125 04/21/2012 0625   TRIG 146 04/21/2012 0625   HDL 50 04/21/2012 0625   CHOLHDL 2.5 04/21/2012 0625   VLDL 29 04/21/2012 0625   LDLCALC 46 04/21/2012 0625    Physical Exam:    VS:  BP 140/68   Pulse 61   Ht 5\' 3"  (1.6 m)   Wt 146 lb 6.4 oz (66.4 kg)   BMI 25.93 kg/m     Wt Readings from Last 3 Encounters:  03/09/18 146 lb 6.4 oz (66.4 kg)  11/04/15 156 lb (70.8 kg)  08/08/15 154 lb (69.9 kg)     GEN:  Well nourished, well developed in no acute distress HEENT: Normal NECK: No JVD; No carotid bruits LYMPHATICS: No lymphadenopathy CARDIAC: RRR, no murmurs, rubs, gallops RESPIRATORY:  Clear to auscultation without rales, wheezing or rhonchi  ABDOMEN: Soft, non-tender, non-distended MUSCULOSKELETAL:  No edema; No deformity  SKIN: Warm and dry NEUROLOGIC:  Alert and oriented x 3 PSYCHIATRIC:  Normal affect   ASSESSMENT:    1. Coronary artery disease involving native coronary artery of native heart without angina pectoris   2. MI, old   3. Dyslipidemia, goal LDL below 70    PLAN:    In order of problems listed above:  1.  ASCAD - she is s/p remote NSTEMI with PCI to the RCA.  She has not had any anginal symptoms.  Her typical anginal pain with her MI it was chest burning radiation to the jaw.  She has not had any the symptoms.  I am concerned though that she is now having problems with exertional fatigue and is noticed some dyspnea on exertion.  I am going to get a Lexiscan Myoview to rule out ischemia.  I will also check a 2D echocardiogram to make sure LV function is still normal.  She will continue on aspirin 81 mg daily, Toprol-XL 50 mg daily, Plavix 75 mg daily and Crestor 5 mg daily.  2.  Remote MI - see above - she is stable from a cardiac standpoint.  2D echocardiogram in 2013 showed normal LV function with EF 60 to 65%.  Continue on  aspirin, Plavix, beta-blocker and statin.  3.  Hyperlipidemia -DL goal is less than 70.  She will continue on Crestor 5 mg daily.  Her PCP follows her lipids.   Medication Adjustments/Labs and Tests Ordered:  Current medicines are reviewed at length with the patient today.  Concerns regarding medicines are outlined above.  Orders Placed This Encounter  Procedures  . Basic metabolic panel  . CBC  . TSH  . MYOCARDIAL PERFUSION IMAGING  . EKG 12-Lead  . ECHOCARDIOGRAM COMPLETE   No orders of the defined types were placed in this encounter.   Signed, Fransico Him, MD  03/09/2018 10:07 AM    [facility]

## 2018-03-13 ENCOUNTER — Telehealth: Payer: Self-pay

## 2018-03-13 NOTE — Telephone Encounter (Signed)
-----   Message from Teressa Senter, RN sent at 03/10/2018  8:33 AM EDT ----- To CMA pool

## 2018-03-13 NOTE — Telephone Encounter (Signed)
Pt is aware and agreeable to lab results. 

## 2018-03-14 ENCOUNTER — Telehealth (HOSPITAL_COMMUNITY): Payer: Self-pay | Admitting: *Deleted

## 2018-03-14 NOTE — Telephone Encounter (Signed)
Left message on voicemail per DPR in reference to upcoming appointment scheduled on 03/17/18 at 0800 with detailed instructions given per Myocardial Perfusion Study Information Sheet for the test. LM to arrive 15 minutes early, and that it is imperative to arrive on time for appointment to keep from having the test rescheduled. If you need to cancel or reschedule your appointment, please call the office within 24 hours of your appointment. Failure to do so may result in a cancellation of your appointment, and a $50 no show fee. Phone number given for call back for any questions. Also left message to ask patient to come in at [phone] instead of [phone]. Radford Pease, [place]

## 2018-03-17 ENCOUNTER — Ambulatory Visit (HOSPITAL_BASED_OUTPATIENT_CLINIC_OR_DEPARTMENT_OTHER): Payer: Medicare Other

## 2018-03-17 ENCOUNTER — Other Ambulatory Visit: Payer: Self-pay

## 2018-03-17 ENCOUNTER — Other Ambulatory Visit: Payer: Self-pay | Admitting: Cardiology

## 2018-03-17 ENCOUNTER — Ambulatory Visit (HOSPITAL_COMMUNITY): Payer: Medicare Other | Attending: Cardiology

## 2018-03-17 DIAGNOSIS — I2510 Atherosclerotic heart disease of native coronary artery without angina pectoris: Secondary | ICD-10-CM

## 2018-03-17 DIAGNOSIS — I252 Old myocardial infarction: Secondary | ICD-10-CM

## 2018-03-17 DIAGNOSIS — E785 Hyperlipidemia, unspecified: Secondary | ICD-10-CM

## 2018-03-17 DIAGNOSIS — E119 Type 2 diabetes mellitus without complications: Secondary | ICD-10-CM | POA: Diagnosis not present

## 2018-03-17 DIAGNOSIS — I25110 Atherosclerotic heart disease of native coronary artery with unstable angina pectoris: Secondary | ICD-10-CM

## 2018-03-17 DIAGNOSIS — C50912 Malignant neoplasm of unspecified site of left female breast: Secondary | ICD-10-CM | POA: Diagnosis not present

## 2018-03-17 DIAGNOSIS — Z9889 Other specified postprocedural states: Secondary | ICD-10-CM | POA: Insufficient documentation

## 2018-03-17 DIAGNOSIS — I5189 Other ill-defined heart diseases: Secondary | ICD-10-CM | POA: Diagnosis not present

## 2018-03-17 DIAGNOSIS — Z87891 Personal history of nicotine dependence: Secondary | ICD-10-CM | POA: Diagnosis not present

## 2018-03-17 LAB — MYOCARDIAL PERFUSION IMAGING
CHL CUP RESTING HR STRESS: 67 {beats}/min
CSEPPHR: 90 {beats}/min
LVDIAVOL: 29 mL (ref 46–106)
LVSYSVOL: 2 mL
RATE: 0.37
SDS: 7
SRS: 7
SSS: 10
TID: 0.73

## 2018-03-17 MED ORDER — TECHNETIUM TC 99M TETROFOSMIN IV KIT
30.5000 | PACK | Freq: Once | INTRAVENOUS | Status: AC | PRN
  Administered 2018-03-17: 30.5 via INTRAVENOUS
  Filled 2018-03-17: qty 31

## 2018-03-17 MED ORDER — REGADENOSON 0.4 MG/5ML IV SOLN
0.4000 mg | Freq: Once | INTRAVENOUS | Status: AC
  Administered 2018-03-17: 0.4 mg via INTRAVENOUS

## 2018-03-17 MED ORDER — TECHNETIUM TC 99M TETROFOSMIN IV KIT
10.5000 | PACK | Freq: Once | INTRAVENOUS | Status: AC | PRN
  Administered 2018-03-17: 10.5 via INTRAVENOUS
  Filled 2018-03-17: qty 11

## 2018-03-21 ENCOUNTER — Telehealth: Payer: Self-pay

## 2018-03-21 DIAGNOSIS — R0602 Shortness of breath: Secondary | ICD-10-CM

## 2018-03-21 NOTE — Telephone Encounter (Signed)
Notes recorded by Teressa Senter, RN on 03/21/2018 at 10:54 AM EDT Pt made aware of echo results and Dr. Theodosia Blender recommendation for BNP. Pt states she will be in [facility] for 2 weeks since her daughter is out of the country. Pt denies sob and states she went shopping all day yesterday and felt fine. Scheduled pt for BNP on 04/07/18. Informed her that if symptoms return or get worse to call office. She stated understanding and thankful for the call   Notes recorded by Sueanne Margarita, MD on 03/20/2018 at 7:01 PM EDT Given SOB please get a BNP

## 2018-04-07 ENCOUNTER — Other Ambulatory Visit: Payer: Medicare Other | Admitting: *Deleted

## 2018-04-07 DIAGNOSIS — R0602 Shortness of breath: Secondary | ICD-10-CM

## 2018-04-07 LAB — PRO B NATRIURETIC PEPTIDE: NT-PRO BNP: 69 pg/mL (ref 0–738)

## 2018-04-10 ENCOUNTER — Telehealth: Payer: Self-pay | Admitting: *Deleted

## 2018-04-10 NOTE — Telephone Encounter (Signed)
LMOVM OF NORMAL RESULTS AND LEFT CLINIC NUMBER [phone] IF ANY QUESTIONS OR CONCERN

## 2018-04-10 NOTE — Telephone Encounter (Signed)
-----   Message from Teressa Senter, RN sent at 04/10/2018  9:19 AM EDT ----- To cma pool

## 2018-08-03 ENCOUNTER — Telehealth: Payer: Self-pay | Admitting: Cardiology

## 2018-08-03 NOTE — Telephone Encounter (Signed)
New message    Patient daughter states that the patient is having a tooth pulled and is taking an antibiotic. She wants to know if this antibiotic will be okay to take with other medications. Please call to discuss.

## 2018-08-03 NOTE — Telephone Encounter (Signed)
Spoke with the patient's daughter, she was concerned about her taking amoxicillin before her tooth extraction. Advised the patient that this was not a problem and that the oral surgeon would need to reach out to our office with a clearance to stop her anticoagulants in the future.

## 2019-03-13 ENCOUNTER — Telehealth: Payer: Self-pay | Admitting: Cardiology

## 2019-03-13 NOTE — Telephone Encounter (Signed)
Call to confirm appointment. Per patient doing fine want to wait til Aug.   Schedule for 06/19/19.

## 2019-03-16 ENCOUNTER — Ambulatory Visit: Payer: Medicare Other | Admitting: Cardiology

## 2019-03-30 ENCOUNTER — Telehealth: Payer: Self-pay | Admitting: Cardiology

## 2019-03-30 NOTE — Telephone Encounter (Signed)
No antibiotic prophylaxis needed from cardiac standpoint.  She will have to check with her oral surgeon in regards to if any heart meds need to be held

## 2019-03-30 NOTE — Telephone Encounter (Signed)
Daughter called. The patient is having dental work done next Wednesday. The daughter wold like to know if there are any specific instructions that she will need to follow in regards to her cardiac care after the dental work, specifically any antibiotics or adjustment in medications. Please call by Monday afternoon.

## 2019-03-30 NOTE — Telephone Encounter (Signed)
Spoke with the daughter, she expressed understanding.

## 2019-03-30 NOTE — Telephone Encounter (Signed)
Spoke with the daughter, she is having 4 surgical extractions without anesthesia.

## 2019-06-18 ENCOUNTER — Other Ambulatory Visit: Payer: Self-pay | Admitting: *Deleted

## 2019-06-18 ENCOUNTER — Telehealth: Payer: Self-pay | Admitting: *Deleted

## 2019-06-18 NOTE — Telephone Encounter (Signed)

## 2019-06-19 ENCOUNTER — Telehealth (INDEPENDENT_AMBULATORY_CARE_PROVIDER_SITE_OTHER): Payer: Medicare Other | Admitting: Cardiology

## 2019-06-19 ENCOUNTER — Other Ambulatory Visit: Payer: Self-pay

## 2019-06-19 ENCOUNTER — Telehealth: Payer: Self-pay | Admitting: *Deleted

## 2019-06-19 DIAGNOSIS — E785 Hyperlipidemia, unspecified: Secondary | ICD-10-CM | POA: Diagnosis not present

## 2019-06-19 DIAGNOSIS — N181 Chronic kidney disease, stage 1: Secondary | ICD-10-CM | POA: Diagnosis not present

## 2019-06-19 DIAGNOSIS — E0822 Diabetes mellitus due to underlying condition with diabetic chronic kidney disease: Secondary | ICD-10-CM | POA: Diagnosis not present

## 2019-06-19 DIAGNOSIS — Z794 Long term (current) use of insulin: Secondary | ICD-10-CM

## 2019-06-19 DIAGNOSIS — I2510 Atherosclerotic heart disease of native coronary artery without angina pectoris: Secondary | ICD-10-CM | POA: Diagnosis not present

## 2019-06-19 NOTE — Telephone Encounter (Signed)
Called and left message for patient that we need most recent lab results (FLP,ALT,BMP) from her PCP office or the name of her PCP so that we can call to request it.

## 2019-06-19 NOTE — Patient Instructions (Addendum)
Medication Instructions:  The current medical regimen is effective;  continue present plan and medications.  If you need a refill on your cardiac medications before your next appointment, please call your pharmacy.   Follow-Up: Follow up in 1 year with Dr. Radford Pax.  You will receive a letter in the mail 2 months before you are due.  Please call us when you receive this letter to schedule your follow up appointment.  Thank you for choosing [facility]!!

## 2019-06-19 NOTE — Progress Notes (Signed)
Virtual Visit via Telephone Note   This visit type was conducted due to national recommendations for restrictions regarding the COVID-19 Pandemic (e.g. social distancing) in an effort to limit this patient's exposure and mitigate transmission in our community.  Due to her co-morbid illnesses, this patient is at least at moderate risk for complications without adequate follow up.  This format is felt to be most appropriate for this patient at this time.  The patient did not have access to video technology/had technical difficulties with video requiring transitioning to audio format only (telephone).  All issues noted in this document were discussed and addressed.  No physical exam could be performed with this format.  Please refer to the patient's chart for her  consent to telehealth for [facility].   Evaluation Performed:  Follow-up visit  This visit type was conducted due to national recommendations for restrictions regarding the COVID-19 Pandemic (e.g. social distancing).  This format is felt to be most appropriate for this patient at this time.  All issues noted in this document were discussed and addressed.  No physical exam was performed (except for noted visual exam findings with Video Visits).  Please refer to the patient's chart (MyChart message for video visits and phone note for telephone visits) for the patient's consent to telehealth for [facility].  Date:  06/19/2019   ID:  Lacey Kemp, DOB 10-May-1931, MRN [id number]  Patient Location:  Home  Provider location:   [facility]  PCP:  Patient, No Pcp Per  Cardiologist:  Fransico Him, MD Electrophysiologist:  None   Chief Complaint:  CAD, HLD  History of Present Illness:    Lacey Kemp is a 85 y.o. female who presents via audio/video conferencing for a telehealth visit today.   Lacey Kemp is an 85y.o. female with a hx of ASCAD with NSTEMI with stenosis of the RCA stent in [place]. Her symptoms at that time  were heartburn that radiated into her jaw. She also has a history of DM, dyslipidemia and hypothyroidism.  She is not on BB due to bradycardia.   She is here today for followup and is doing well.  She denies any chest pain or pressure, SOB, DOE, PND, orthopnea, LE edema, dizziness, palpitations or syncope. She is compliant with her meds and is tolerating meds with no SE.    The patient does not have symptoms concerning for COVID-19 infection (fever, chills, cough, or new shortness of breath).    Prior CV studies:   The following studies were reviewed today:  none  Past Medical History:  Diagnosis Date  . Breast CA ([place]) 12/08/2015   S/p lumpectomy left breast  . Coronary artery disease    s/p NSTEMI with PCI of RCA  . Diabetes mellitus   . Dyslipidemia, goal LDL below 70   . Thyroid disease    Past Surgical History:  Procedure Laterality Date  . BREAST LUMPECTOMY    . CARDIAC CATHETERIZATION    . CORONARY ANGIOPLASTY     PCI of RCA  . stent implant  [place]:[id number]     Current Meds  Medication Sig  . aspirin EC 81 MG tablet Take 81 mg by mouth daily.  . clopidogrel (PLAVIX) 75 MG tablet Take 75 mg by mouth daily.  . Empagliflozin-metFORMIN HCl ER (SYNJARDY XR) 25-1000 MG TB24 Take 25-1,000 mg by mouth daily.  Marland Kitchen levothyroxine (SYNTHROID) 75 MCG tablet Take 75 mcg by mouth daily with breakfast.  . rosuvastatin (CRESTOR) 5 MG tablet Take 5 mg  by mouth daily.  . sitaGLIPtin (JANUVIA) 50 MG tablet Take 50 mg by mouth daily.  . [DISCONTINUED] aspirin 81 MG tablet Take 325 mg by mouth daily.      Allergies:   Patient has no known allergies.   Social History   Tobacco Use  . Smoking status: Former Research scientist (life sciences)  . Smokeless tobacco: Never Used  Substance Use Topics  . Alcohol use: No  . Drug use: No     Family Hx: The patient's family history includes Arrhythmia in her brother; Breast cancer in her sister; Emphysema in her mother; Prostate cancer in her father.  ROS:   Please see  the history of present illness.     All other systems reviewed and are negative.   Labs/Other Tests and Data Reviewed:    Recent Labs: No results found for requested labs within last 8760 hours.   Recent Lipid Panel Lab Results  Component Value Date/Time   CHOL 125 04/21/2012 06:25 AM   TRIG 146 04/21/2012 06:25 AM   HDL 50 04/21/2012 06:25 AM   CHOLHDL 2.5 04/21/2012 06:25 AM   LDLCALC 46 04/21/2012 06:25 AM    Wt Readings from Last 3 Encounters:  06/19/19 154 lb (69.9 kg)  03/09/18 146 lb 6.4 oz (66.4 kg)  11/04/15 156 lb (70.8 kg)     Objective:    Vital Signs:  BP 129/70   Pulse 61   Ht 5\' 3"  (1.6 m)   Wt 154 lb (69.9 kg)   BMI 27.28 kg/m     ASSESSMENT & PLAN:    1.  ASCAD - she is s/p remote NSTEMI with PCI to the RCA.  Lexiscan myoview a year ago showed no ischemia.  She has not had any CP or SOB.  She will continue on ASA daily, Plavix 75mg  daily, Toprol XL 50mg  daily and Crestor 5mg  daily.    2.  Hyperlipidemia - her LDL goal is < 70.  I will get a copy of her FLP and ALT from her PCP.  She will continue on Crestor 5mg  daily.    3.  Type 2 DM - followed by her PCP.  She will continue on Januvia 50mg  daily and Empagliflozin-Metformin 25-1000mg  daily.    COVID-19 Education: The signs and symptoms of COVID-19 were discussed with the patient and how to seek care for testing (follow up with PCP or arrange E-visit).  The importance of social distancing was discussed today.  Patient Risk:   After full review of this patient's clinical status, I feel that they are at least moderate risk at this time.  Time:   Today, I have spent 20 minutes directly with the patient on telemedicine discussing medical problems including CAD, HLD, DM.  We also reviewed the symptoms of COVID 19 and the ways to protect against contracting the virus with telehealth technology.  I spent an additional 5 minutes reviewing patient's chart including stress test.  Medication Adjustments/Labs  and Tests Ordered: Current medicines are reviewed at length with the patient today.  Concerns regarding medicines are outlined above.  Tests Ordered: No orders of the defined types were placed in this encounter.  Medication Changes: No orders of the defined types were placed in this encounter.   Disposition:  Follow up in 1 year(s)  Signed, Fransico Him, MD  06/19/2019 11:29 AM    [facility]

## 2019-06-27 NOTE — Telephone Encounter (Signed)
Left another message that we need either pt's PCP or name of Dr who did her latest blood work.  Per Dr Radford Pax, she wants results of most recent labs.

## 2019-07-11 NOTE — Telephone Encounter (Signed)
Will await lab results from PCP.

## 2019-08-22 ENCOUNTER — Telehealth: Payer: Self-pay | Admitting: Cardiology

## 2019-08-22 DIAGNOSIS — E785 Hyperlipidemia, unspecified: Secondary | ICD-10-CM

## 2019-08-22 NOTE — Telephone Encounter (Signed)
I spoke to the patient's daughter Precious Bard) who called to inform us that there were never any labs drawn at the patient's PCP (Dr Deedra Ehrich) in [place].    Dr Radford Pax requested the most recent FLP; ALT; BMP from the patient's PCP, while a visit with Dr Radford Pax in late August.  They are willing to come in for labs if you would like them to.  There is also a note about recent dental work.  Please advise, thank you

## 2019-08-22 NOTE — Telephone Encounter (Signed)
Needs FLP and ALT

## 2019-08-22 NOTE — Telephone Encounter (Signed)
I spoke to the patient's daughter with Dr Theodosia Blender recommendation for lab draw.  They will come in Friday.

## 2019-08-22 NOTE — Telephone Encounter (Signed)
Daughter of the patient wanted to know if Dr. Radford Pax would want the patient to do labs at our office. The patient missed her regular follow-up appt and lab draw with her PCP.  Please contact the daughter to let her know what Dr.Turner decides, and the daughter will bring the patient in if necessary.  The daughter also mentioned that the patient did also have 4 teeth pulled. The procedure went fine, and only had bruising as a result. No modifications were made to the patient's medication, and no additional medicine was needed

## 2019-08-24 ENCOUNTER — Other Ambulatory Visit: Payer: Medicare Other

## 2019-10-09 ENCOUNTER — Ambulatory Visit: Payer: Medicare Other | Admitting: Cardiology

## 2019-10-09 ENCOUNTER — Other Ambulatory Visit: Payer: Medicare Other

## 2019-10-09 NOTE — Progress Notes (Deleted)
Cardiology Office Note:    Date:  10/09/2019   ID:  [name], DOB November 10, 1930, MRN [id number]  PCP:  Patient, No Pcp Per  Cardiologist:  Fransico Him, MD    Referring MD: No ref. provider found   No chief complaint on file.   History of Present Illness:    Lacey Kemp is a 85 y.o. female with a hx of ASCAD with NSTEMI with stenosis of the RCA stent in [place]. Her symptoms at that time were heartburn that radiated into her jaw. She also has a history of DM, dyslipidemia and hypothyroidism.She is not on BB due to bradycardia.   She is here today for followup and is doing well.  She denies any chest pain or pressure, SOB, DOE, PND, orthopnea, LE edema, dizziness, palpitations or syncope. She is compliant with her meds and is tolerating meds with no SE.    Past Medical History:  Diagnosis Date  . Breast CA ([place]) 12/08/2015   S/p lumpectomy left breast  . Coronary artery disease    s/p NSTEMI with PCI of RCA  . Diabetes mellitus   . Dyslipidemia, goal LDL below 70   . Thyroid disease     Past Surgical History:  Procedure Laterality Date  . BREAST LUMPECTOMY    . CARDIAC CATHETERIZATION    . CORONARY ANGIOPLASTY     PCI of RCA  . stent implant  [id number]    Current Medications: No outpatient medications have been marked as taking for the 10/09/19 encounter (Appointment) with Sueanne Margarita, MD.     Allergies:   Patient has no known allergies.   Social History   Socioeconomic History  . Marital status: Widowed    Spouse name: Not on file  . Number of children: Not on file  . Years of education: Not on file  . Highest education level: Not on file  Occupational History  . Not on file  Tobacco Use  . Smoking status: Former Research scientist (life sciences)  . Smokeless tobacco: Never Used  Substance and Sexual Activity  . Alcohol use: No  . Drug use: No  . Sexual activity: Never  Other Topics Concern  . Not on file  Social History Narrative  . Not on file   Social  Determinants of Health   Financial Resource Strain:   . Difficulty of Paying Living Expenses: Not on file  Food Insecurity:   . Worried About Charity fundraiser in the Last Year: Not on file  . Ran Out of Food in the Last Year: Not on file  Transportation Needs:   . Lack of Transportation (Medical): Not on file  . Lack of Transportation (Non-Medical): Not on file  Physical Activity:   . Days of Exercise per Week: Not on file  . Minutes of Exercise per Session: Not on file  Stress:   . Feeling of Stress : Not on file  Social Connections:   . Frequency of Communication with Friends and Family: Not on file  . Frequency of Social Gatherings with Friends and Family: Not on file  . Attends Religious Services: Not on file  . Active Member of Clubs or Organizations: Not on file  . Attends Archivist Meetings: Not on file  . Marital Status: Not on file     Family History: The patient's family history includes Arrhythmia in her brother; Breast cancer in her sister; Emphysema in her mother; Prostate cancer in her father.  ROS:   Please see the history of  present illness.    ROS  All other systems reviewed and negative.   EKGs/Labs/Other Studies Reviewed:    The following studies were reviewed today: none  EKG:  EKG is  ordered today.  The ekg ordered today demonstrates ***  Recent Labs: No results found for requested labs within last 8760 hours.   Recent Lipid Panel    Component Value Date/Time   CHOL 125 04/21/2012 0625   TRIG 146 04/21/2012 0625   HDL 50 04/21/2012 0625   CHOLHDL 2.5 04/21/2012 0625   VLDL 29 04/21/2012 0625   LDLCALC 46 04/21/2012 0625    Physical Exam:    VS:  There were no vitals taken for this visit.    Wt Readings from Last 3 Encounters:  06/19/19 154 lb (69.9 kg)  03/09/18 146 lb 6.4 oz (66.4 kg)  11/04/15 156 lb (70.8 kg)     GEN:  Well nourished, well developed in no acute distress HEENT: Normal NECK: No JVD; No carotid  bruits LYMPHATICS: No lymphadenopathy CARDIAC: RRR, no murmurs, rubs, gallops RESPIRATORY:  Clear to auscultation without rales, wheezing or rhonchi  ABDOMEN: Soft, non-tender, non-distended MUSCULOSKELETAL:  No edema; No deformity  SKIN: Warm and dry NEUROLOGIC:  Alert and oriented x 3 PSYCHIATRIC:  Normal affect   ASSESSMENT:    1. Coronary artery disease involving native coronary artery of native heart without angina pectoris   2. Dyslipidemia, goal LDL below 70   3. 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  ASCAD -s/p remoteNSTEMIwith PCI to the RCA -no ischemia on Lexiscan myoview in 2019 -denies any angina -continue ASA 81mg  daily, Plavix 75mg  daily, Toprol XL 50mg  daily and statin.   2.  HLD -LDL goal < 70 -continue Crestor 5mg  daily  3.  Type 2 DM -followed by PCP -continue Januvia 50mg  daily and Empagliflozin-Metformin 25-1000mg  daily.    Medication Adjustments/Labs and Tests Ordered: Current medicines are reviewed at length with the patient today.  Concerns regarding medicines are outlined above.  No orders of the defined types were placed in this encounter.  No orders of the defined types were placed in this encounter.   Signed, Fransico Him, MD  10/09/2019 7:28 AM    [place]

## 2019-10-10 ENCOUNTER — Telehealth (INDEPENDENT_AMBULATORY_CARE_PROVIDER_SITE_OTHER): Payer: Medicare Other | Admitting: Cardiology

## 2019-10-10 ENCOUNTER — Telehealth: Payer: Self-pay | Admitting: Cardiology

## 2019-10-10 ENCOUNTER — Encounter: Payer: Self-pay | Admitting: Cardiology

## 2019-10-10 VITALS — BP 115/68 | HR 80 | Ht 63.0 in | Wt 145.0 lb

## 2019-10-10 DIAGNOSIS — I2510 Atherosclerotic heart disease of native coronary artery without angina pectoris: Secondary | ICD-10-CM | POA: Diagnosis not present

## 2019-10-10 DIAGNOSIS — N181 Chronic kidney disease, stage 1: Secondary | ICD-10-CM

## 2019-10-10 DIAGNOSIS — E0822 Diabetes mellitus due to underlying condition with diabetic chronic kidney disease: Secondary | ICD-10-CM | POA: Diagnosis not present

## 2019-10-10 DIAGNOSIS — E785 Hyperlipidemia, unspecified: Secondary | ICD-10-CM

## 2019-10-10 DIAGNOSIS — Z794 Long term (current) use of insulin: Secondary | ICD-10-CM

## 2019-10-10 NOTE — Patient Instructions (Signed)
Medication Instructions:  Your physician recommends that you continue on your current medications as directed. Please refer to the Current Medication list given to you today.  *If you need a refill on your cardiac medications before your next appointment, please call your pharmacy*  Lab Work: Fasting lipid panel and ALT on Monday 10/15/19  If you have labs (blood work) drawn today and your tests are completely normal, you will receive your results only by: Marland Kitchen MyChart Message (if you have MyChart) OR . A paper copy in the mail If you have any lab test that is abnormal or we need to change your treatment, we will call you to review the results.  Follow-Up: At [facility], you and your health needs are our priority.  As part of our continuing mission to provide you with exceptional heart care, we have created designated Provider Care Teams.  These Care Teams include your primary Cardiologist (physician) and Advanced Practice Providers (APPs -  Physician Assistants and Nurse Practitioners) who all work together to provide you with the care you need, when you need it.  Your next appointment:   1 year(s)  The format for your next appointment:   Either In Person or Virtual  Provider:   Fransico Him, MD

## 2019-10-10 NOTE — Telephone Encounter (Signed)
Spoke with patients daughter and confirmed that appointment would be switched to virtual. Verbal consent was provided.   YOUR CARDIOLOGY TEAM HAS ARRANGED FOR AN E-VISIT FOR YOUR APPOINTMENT - PLEASE REVIEW IMPORTANT INFORMATION BELOW SEVERAL DAYS PRIOR TO YOUR APPOINTMENT  Due to the recent COVID-19 pandemic, we are transitioning in-person office visits to tele-medicine visits in an effort to decrease unnecessary exposure to our patients, their families, and staff. These visits are billed to your insurance just like a normal visit is. We also encourage you to sign up for MyChart if you have not already done so. You will need a smartphone if possible. For patients that do not have this, we can still complete the visit using a regular telephone but do prefer a smartphone to enable video when possible. You may have a family member that lives with you that can help. If possible, we also ask that you have a blood pressure cuff and scale at home to measure your blood pressure, heart rate and weight prior to your scheduled appointment. Patients with clinical needs that need an in-person evaluation and testing will still be able to come to the office if absolutely necessary. If you have any questions, feel free to call our office.   THE DAY OF YOUR APPOINTMENT  Approximately 15 minutes prior to your scheduled appointment, you will receive a telephone call from one of [facility] team - your caller ID may say "Unknown caller."  Our staff will confirm medications, vital signs for the day and any symptoms you may be experiencing. Please have this information available prior to the time of visit start. It may also be helpful for you to have a pad of paper and pen handy for any instructions given during your visit. They will also walk you through joining the smartphone meeting if this is a video visit.    CONSENT FOR TELE-HEALTH VISIT - PLEASE REVIEW  I hereby voluntarily request, consent and authorize [facility] and its employed or contracted physicians, physician assistants, nurse practitioners or other licensed health care professionals (the Practitioner), to provide me with telemedicine health care services (the "Services") as deemed necessary by the treating Practitioner. I acknowledge and consent to receive the Services by the Practitioner via telemedicine. I understand that the telemedicine visit will involve communicating with the Practitioner through live audiovisual communication technology and the disclosure of certain medical information by electronic transmission. I acknowledge that I have been given the opportunity to request an in-person assessment or other available alternative prior to the telemedicine visit and am voluntarily participating in the telemedicine visit.  I understand that I have the right to withhold or withdraw my consent to the use of telemedicine in the course of my care at any time, without affecting my right to future care or treatment, and that the Practitioner or I may terminate the telemedicine visit at any time. I understand that I have the right to inspect all information obtained and/or recorded in the course of the telemedicine visit and may receive copies of available information for a reasonable fee.  I understand that some of the potential risks of receiving the Services via telemedicine include:  Marland Kitchen Delay or interruption in medical evaluation due to technological equipment failure or disruption; . Information transmitted may not be sufficient (e.g. poor resolution of images) to allow for appropriate medical decision making by the Practitioner; and/or  . In rare instances, security protocols could fail, causing a breach of personal health information.  Furthermore, I acknowledge  that it is my responsibility to provide information about my medical history, conditions and care that is complete and accurate to the best of my ability. I acknowledge that Practitioner's  advice, recommendations, and/or decision may be based on factors not within their control, such as incomplete or inaccurate data provided by me or distortions of diagnostic images or specimens that may result from electronic transmissions. I understand that the practice of medicine is not an exact science and that Practitioner makes no warranties or guarantees regarding treatment outcomes. I acknowledge that I will receive a copy of this consent concurrently upon execution via email to the email address I last provided but may also request a printed copy by calling the office of [facility].    I understand that my insurance will be billed for this visit.   I have read or had this consent read to me. . I understand the contents of this consent, which adequately explains the benefits and risks of the Services being provided via telemedicine.  . I have been provided ample opportunity to ask questions regarding this consent and the Services and have had my questions answered to my satisfaction. . I give my informed consent for the services to be provided through the use of telemedicine in my medical care  By participating in this telemedicine visit I agree to the above.

## 2019-10-10 NOTE — Telephone Encounter (Signed)
Patients daughter would like the appt today with Dr. Radford Pax at 1:40pm to be virtual. Please advise if this is possible.

## 2019-10-10 NOTE — Addendum Note (Signed)
Addended by: Antonieta Iba on: 10/10/2019 02:13 PM   Modules accepted: Orders

## 2019-10-10 NOTE — Progress Notes (Signed)
Virtual Visit via Telephone Note   This visit type was conducted due to national recommendations for restrictions regarding the COVID-19 Pandemic (e.g. social distancing) in an effort to limit this patient's exposure and mitigate transmission in our community.  Due to her co-morbid illnesses, this patient is at least at moderate risk for complications without adequate follow up.  This format is felt to be most appropriate for this patient at this time.  The patient did not have access to video technology/had technical difficulties with video requiring transitioning to audio format only (telephone).  All issues noted in this document were discussed and addressed.  No physical exam could be performed with this format.  Please refer to the patient's chart for her  consent to telehealth for [facility].   Evaluation Performed:  Follow-up visit  This visit type was conducted due to national recommendations for restrictions regarding the COVID-19 Pandemic (e.g. social distancing).  This format is felt to be most appropriate for this patient at this time.  All issues noted in this document were discussed and addressed.  No physical exam was performed (except for noted visual exam findings with Video Visits).  Please refer to the patient's chart (MyChart message for video visits and phone note for telephone visits) for the patient's consent to telehealth for [facility].  Date:  10/10/2019   ID:  Lacey Kemp, DOB Feb 28, 1931, MRN [id number]  Patient Location:  Home  Provider location:   [facility]  PCP:  Patient, No Pcp Per  Cardiologist:  Fransico Him, MD  Electrophysiologist:  None   Chief Complaint:  CAD, HLD  History of Present Illness:    Lacey Kemp is a 85 y.o. female who presents via audio/video conferencing for a telehealth visit today.    Lacey Colantuonois an 85y.o.femalewith a hx of ASCAD with NSTEMI with stenosis of the RCA stent in [place]. Her symptoms at that time  were heartburn that radiated into her jaw. She also has a history of DM, dyslipidemia and hypothyroidism.She is not on BB due to bradycardia.  She is here today for followup and is doing well.  She denies any chest pain or pressure, SOB, DOE, PND, orthopnea, LE edema, dizziness, palpitations or syncope. She is compliant with his meds and is tolerating meds with no SE.     The patient does not have symptoms concerning for COVID-19 infection (fever, chills, cough, or new shortness of breath).    Prior CV studies:   The following studies were reviewed today:  none  Past Medical History:  Diagnosis Date  . Breast CA ([place]) 12/08/2015   S/p lumpectomy left breast  . Coronary artery disease    s/p NSTEMI with PCI of RCA  . Diabetes mellitus   . Dyslipidemia, goal LDL below 70   . Thyroid disease    Past Surgical History:  Procedure Laterality Date  . BREAST LUMPECTOMY    . CARDIAC CATHETERIZATION    . CORONARY ANGIOPLASTY     PCI of RCA  . stent implant  [place]:[id number]     Current Meds  Medication Sig  . aspirin EC 81 MG tablet Take 81 mg by mouth daily.  . clopidogrel (PLAVIX) 75 MG tablet Take 75 mg by mouth daily.  Marland Kitchen ELDERBERRY PO Take 1 tablespoon by mouth daily  . Empagliflozin-metFORMIN HCl ER (SYNJARDY XR) 25-1000 MG TB24 Take 25-1,000 mg by mouth daily.  Marland Kitchen ibuprofen (ADVIL) 200 MG tablet Take by mouth as needed.  Marland Kitchen levothyroxine (SYNTHROID) 75 MCG  tablet Take 75 mcg by mouth daily with breakfast.  . rosuvastatin (CRESTOR) 5 MG tablet Take 5 mg by mouth daily.  . sitaGLIPtin (JANUVIA) 50 MG tablet Take 50 mg by mouth daily.     Allergies:   Patient has no known allergies.   Social History   Tobacco Use  . Smoking status: Former Research scientist (life sciences)  . Smokeless tobacco: Never Used  Substance Use Topics  . Alcohol use: No  . Drug use: No     Family Hx: The patient's family history includes Arrhythmia in her brother; Breast cancer in her sister; Emphysema in her mother; Prostate  cancer in her father.  ROS:   Please see the history of present illness.     All other systems reviewed and are negative.   Labs/Other Tests and Data Reviewed:    Recent Labs: No results found for requested labs within last 8760 hours.   Recent Lipid Panel Lab Results  Component Value Date/Time   CHOL 125 04/21/2012 06:25 AM   TRIG 146 04/21/2012 06:25 AM   HDL 50 04/21/2012 06:25 AM   CHOLHDL 2.5 04/21/2012 06:25 AM   LDLCALC 46 04/21/2012 06:25 AM    Wt Readings from Last 3 Encounters:  10/10/19 145 lb (65.8 kg)  06/19/19 154 lb (69.9 kg)  03/09/18 146 lb 6.4 oz (66.4 kg)     Objective:    Vital Signs:  BP 115/68   Pulse 80   Ht 5\' 3"  (1.6 m)   Wt 145 lb (65.8 kg)   BMI 25.69 kg/m     ASSESSMENT & PLAN:    1.  ASCAD -she is s/p remoteNSTEMIwith PCI to the RCA.   Leane Call a year ago showed no ischemia.  -she has not had any anginal sx. -continue ASA 81mg  daily, Plavix 75mg  daily, Toprol XL 50mg  daily and statin  2.  HLD -LDL goal < 70 -check FLP and ALT -continue Crestor 5mg  daily  3.  Type 2 DM -followed by PCP -continue Januvia 50mg  daily and Empagliflozin-metformin 25-1000mg  daily   COVID-19 Education: The signs and symptoms of COVID-19 were discussed with the patient and how to seek care for testing (follow up with PCP or arrange E-visit).  The importance of social distancing was discussed today.  Patient Risk:   After full review of this patient's clinical status, I feel that they are at least moderate risk at this time.  Time:   Today, I have spent 20 minutes on telemedicine discussing medical problems including CAD, HLD.  We also reviewed the symptoms of COVID 19 and the ways to protect against contracting the virus with telehealth technology.  I spent an additional 5 minutes reviewing patient's chart including labs.  Medication Adjustments/Labs and Tests Ordered: Current medicines are reviewed at length with the patient today.   Concerns regarding medicines are outlined above.  Tests Ordered: No orders of the defined types were placed in this encounter.  Medication Changes: No orders of the defined types were placed in this encounter.   Disposition:  Follow up in 1 year(s)  Signed, Fransico Him, MD  10/10/2019 1:54 PM    [facility]

## 2019-10-10 NOTE — Progress Notes (Deleted)
Cardiology Office Note:    Date:  10/10/2019   ID:  Lacey Kemp, DOB 01-Jan-1931, MRN [id number]  PCP:  Patient, No Pcp Per  Cardiologist:  Fransico Him, MD    Referring MD: No ref. provider found   No chief complaint on file.   History of Present Illness:    Lacey Kemp is a 85 y.o. female with a hx of ASCAD with NSTEMI with stenosis of the RCA stent in [place]. Her symptoms at that time were heartburn that radiated into her jaw. She also has a history of DM, dyslipidemia and hypothyroidism.She is not on BB due to bradycardia.   She is here today for followup and is doing well.  She denies any chest pain or pressure, SOB, DOE, PND, orthopnea, LE edema, dizziness, palpitations or syncope. She is compliant with her meds and is tolerating meds with no SE.    Past Medical History:  Diagnosis Date  . Breast CA ([place]) 12/08/2015   S/p lumpectomy left breast  . Coronary artery disease    s/p NSTEMI with PCI of RCA  . Diabetes mellitus   . Dyslipidemia, goal LDL below 70   . Thyroid disease     Past Surgical History:  Procedure Laterality Date  . BREAST LUMPECTOMY    . CARDIAC CATHETERIZATION    . CORONARY ANGIOPLASTY     PCI of RCA  . stent implant  [place]:[id number]    Current Medications: No outpatient medications have been marked as taking for the 10/10/19 encounter (Appointment) with Sueanne Margarita, MD.     Allergies:   Patient has no known allergies.   Social History   Socioeconomic History  . Marital status: Widowed    Spouse name: Not on file  . Number of children: Not on file  . Years of education: Not on file  . Highest education level: Not on file  Occupational History  . Not on file  Tobacco Use  . Smoking status: Former Research scientist (life sciences)  . Smokeless tobacco: Never Used  Substance and Sexual Activity  . Alcohol use: No  . Drug use: No  . Sexual activity: Never  Other Topics Concern  . Not on file  Social History Narrative  . Not on file   Social  Determinants of Health   Financial Resource Strain:   . Difficulty of Paying Living Expenses: Not on file  Food Insecurity:   . Worried About Charity fundraiser in the Last Year: Not on file  . Ran Out of Food in the Last Year: Not on file  Transportation Needs:   . Lack of Transportation (Medical): Not on file  . Lack of Transportation (Non-Medical): Not on file  Physical Activity:   . Days of Exercise per Week: Not on file  . Minutes of Exercise per Session: Not on file  Stress:   . Feeling of Stress : Not on file  Social Connections:   . Frequency of Communication with Friends and Family: Not on file  . Frequency of Social Gatherings with Friends and Family: Not on file  . Attends Religious Services: Not on file  . Active Member of Clubs or Organizations: Not on file  . Attends Archivist Meetings: Not on file  . Marital Status: Not on file     Family History: The patient's family history includes Arrhythmia in her brother; Breast cancer in her sister; Emphysema in her mother; Prostate cancer in her father.  ROS:   Please see the history of  present illness.    ROS  All other systems reviewed and negative.   EKGs/Labs/Other Studies Reviewed:    The following studies were reviewed today: none  EKG:  EKG is  ordered today.  The ekg ordered today demonstrates ***  Recent Labs: No results found for requested labs within last 8760 hours.   Recent Lipid Panel    Component Value Date/Time   CHOL 125 04/21/2012 0625   TRIG 146 04/21/2012 0625   HDL 50 04/21/2012 0625   CHOLHDL 2.5 04/21/2012 0625   VLDL 29 04/21/2012 0625   LDLCALC 46 04/21/2012 0625    Physical Exam:    VS:  There were no vitals taken for this visit.    Wt Readings from Last 3 Encounters:  06/19/19 154 lb (69.9 kg)  03/09/18 146 lb 6.4 oz (66.4 kg)  11/04/15 156 lb (70.8 kg)     GEN:  Well nourished, well developed in no acute distress HEENT: Normal NECK: No JVD; No carotid  bruits LYMPHATICS: No lymphadenopathy CARDIAC: RRR, no murmurs, rubs, gallops RESPIRATORY:  Clear to auscultation without rales, wheezing or rhonchi  ABDOMEN: Soft, non-tender, non-distended MUSCULOSKELETAL:  No edema; No deformity  SKIN: Warm and dry NEUROLOGIC:  Alert and oriented x 3 PSYCHIATRIC:  Normal affect   ASSESSMENT:    1. Coronary artery disease involving native coronary artery of native heart without angina pectoris   2. Dyslipidemia, goal LDL below 70   3. 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  ASCAD -she is s/p remoteNSTEMIwith PCI to the RCA.   Leane Call a year ago showed no ischemia.  -she has not had any anginal sx. -continue ASA 81mg  daily, Plavix 75mg  daily, Toprol XL 50mg  daily and statin  2.  HLD -LDL goal < 70 -continue Crestor 5mg  daily  3.  Type 2 DM -followed by PCP -continue Januvia 50mg  daily and Empagliflozin-metformin 25-1000mg  daily  Medication Adjustments/Labs and Tests Ordered: Current medicines are reviewed at length with the patient today.  Concerns regarding medicines are outlined above.  No orders of the defined types were placed in this encounter.  No orders of the defined types were placed in this encounter.   Signed, Fransico Him, MD  10/10/2019 8:04 AM    [place]

## 2019-10-15 ENCOUNTER — Other Ambulatory Visit: Payer: Medicare Other | Admitting: *Deleted

## 2019-10-15 ENCOUNTER — Other Ambulatory Visit: Payer: Self-pay

## 2019-10-15 DIAGNOSIS — E0822 Diabetes mellitus due to underlying condition with diabetic chronic kidney disease: Secondary | ICD-10-CM

## 2019-10-15 DIAGNOSIS — E785 Hyperlipidemia, unspecified: Secondary | ICD-10-CM

## 2019-10-15 DIAGNOSIS — Z794 Long term (current) use of insulin: Secondary | ICD-10-CM

## 2019-10-15 DIAGNOSIS — I2510 Atherosclerotic heart disease of native coronary artery without angina pectoris: Secondary | ICD-10-CM

## 2019-10-15 DIAGNOSIS — N181 Chronic kidney disease, stage 1: Secondary | ICD-10-CM

## 2019-10-15 NOTE — Progress Notes (Signed)
Patients PCP called to order additional labs to have drawn today.

## 2019-10-16 LAB — HEMOGLOBIN A1C
Est. average glucose Bld gHb Est-mCnc: 151 mg/dL
Hgb A1c MFr Bld: 6.9 % — ABNORMAL HIGH (ref 4.8–5.6)

## 2019-10-16 LAB — LIPID PANEL
Chol/HDL Ratio: 2.5 ratio (ref 0.0–4.4)
Cholesterol, Total: 156 mg/dL (ref 100–199)
HDL: 63 mg/dL (ref >39)
LDL Chol Calc (NIH): 65 mg/dL (ref 0–99)
Triglycerides: 167 mg/dL — ABNORMAL HIGH (ref 0–149)
VLDL Cholesterol Cal: 28 mg/dL (ref 5–40)

## 2019-10-16 LAB — BASIC METABOLIC PANEL
BUN/Creatinine Ratio: 9 — ABNORMAL LOW (ref 12–28)
BUN: 9 mg/dL (ref 8–27)
CO2: 22 mmol/L (ref 20–29)
Calcium: 9.7 mg/dL (ref 8.7–10.3)
Chloride: 102 mmol/L (ref 96–106)
Creatinine, Ser: 0.97 mg/dL (ref 0.57–1.00)
GFR calc Af Amer: 61 mL/min/{1.73_m2} (ref >59)
GFR calc non Af Amer: 53 mL/min/{1.73_m2} — ABNORMAL LOW (ref >59)
Glucose: 159 mg/dL — ABNORMAL HIGH (ref 65–99)
Potassium: 4.3 mmol/L (ref 3.5–5.2)
Sodium: 141 mmol/L (ref 134–144)

## 2019-10-16 LAB — ALT: ALT: 8 IU/L (ref 0–32)

## 2019-10-16 LAB — TSH: TSH: 2.34 u[IU]/mL (ref 0.450–4.500)

## 2020-06-23 NOTE — Progress Notes (Signed)
Cardiology Office Note:    Date:  06/24/2020   ID:  Lacey Kemp, DOB 1931/06/05, MRN [id number]  PCP:  Patient, No Pcp Per  [facility] Cardiologist:  Lacey Him, MD   [place] Electrophysiologist:  None   Referring MD: No ref. provider found   Chief Complaint:  Dizziness    Patient Profile:    Lacey Kemp is a 86 y.o. female with:   Coronary artery disease   S/p NSTEMI 2016 >> DES to RCA ([place])  No beta-blocker due to bradycardia   Myoview 5/19: no ischemia  Echocardiogram 5/19 EF 60-65, Gr 1 DD  Diabetes mellitus   Hyperlipidemia   Hypothyroidism   Prior CV studies:  Myoview 03/17/18 EF 92, no ischemia; Low Risk  Echocardiogram 03/17/18 EF EF 60-65, no RWMA, Gr 1 DD, mild LAE  Cardiac catheterization 01/27/15 Lacey Kemp, [place]) LM patent  LAD mid 80-90 LCx patent RCA mid 99 EF 60, inf HK PCI: Xience DES to RCA  History of Present Illness:    Lacey Kemp was last seen by Lacey Kemp in 09/2019 via Telemedicine.  She returns for evaluation of dizziness.  She is here today with her daughter.  She moved here from [place] a few years ago.  She thought she was going to move back to [place] and her primary care physician is still there.  She has not really established with anyone here.  Over the past couple of months, she has had decreased appetite and has lost about 15 pounds.  She has had some dysuria and did a home test recently.  She contacted her PCP in [place] and was placed on Bactrim for possible UTI.  She feels lightheaded when she stands up.  She has not had syncope.  She has had some headaches.  She has not had chest discomfort, orthopnea, leg swelling.  She has noted some mild dyspnea.  She seems unsteady at times.      Past Medical History:  Diagnosis Date  . Breast CA ([place]) 12/08/2015   S/p lumpectomy left breast  . Coronary artery disease    s/p NSTEMI with PCI of RCA  . Diabetes mellitus   .  Dyslipidemia, goal LDL below 70   . Thyroid disease     Current Medications: Current Meds  Medication Sig  . aspirin EC 81 MG tablet Take 81 mg by mouth daily.  . clopidogrel (PLAVIX) 75 MG tablet Take 75 mg by mouth daily.  Marland Kitchen ELDERBERRY PO Take 1 tablespoon by mouth daily  . Empagliflozin-metFORMIN HCl ER (SYNJARDY XR) 25-1000 MG TB24 Take 25-1,000 mg by mouth daily.  Marland Kitchen ibuprofen (ADVIL) 200 MG tablet Take by mouth as needed.  Marland Kitchen levothyroxine (SYNTHROID) 75 MCG tablet Take 75 mcg by mouth daily with breakfast.  . rosuvastatin (CRESTOR) 5 MG tablet Take 5 mg by mouth daily.  . sitaGLIPtin (JANUVIA) 50 MG tablet Take 50 mg by mouth daily.  Marland Kitchen sulfamethoxazole-trimethoprim (BACTRIM DS) 800-160 MG tablet Take 1 tablet by mouth 2 (two) times daily.     Allergies:   Patient has no known allergies.   Social History   Tobacco Use  . Smoking status: Former Research scientist (life sciences)  . Smokeless tobacco: Never Used  Vaping Use  . Vaping Use: Never used  Substance Use Topics  . Alcohol use: No  . Drug use: No     Family Hx: The patient's family history includes Arrhythmia in her brother; Breast cancer in her sister; Emphysema in her mother;  Prostate cancer in her father.  Review of Systems  Constitutional: Negative for chills and fever.  Respiratory: Positive for cough (mild).   Gastrointestinal: Negative for hematochezia and melena.  Genitourinary: Positive for dysuria. Negative for hematuria.  All other systems reviewed and are negative.    EKGs/Labs/Other Test Reviewed:    EKG:  EKG is   ordered today.  The ekg ordered today demonstrates normal sinus rhythm, heart rate 73, normal axis,Nonspecific ST-T wave changes, QTC 423, no change from prior tracing  Recent Labs: 10/15/2019: ALT 8; BUN 9; Creatinine, Ser 0.97; Potassium 4.3; Sodium 141; TSH 2.340   Recent Lipid Panel Lab Results  Component Value Date/Time   CHOL 156 10/15/2019 10:44 AM   TRIG 167 (H) 10/15/2019 10:44 AM   HDL 63  10/15/2019 10:44 AM   CHOLHDL 2.5 10/15/2019 10:44 AM   CHOLHDL 2.5 04/21/2012 06:25 AM   LDLCALC 65 10/15/2019 10:44 AM    Physical Exam:    VS:  BP (!) 100/44   Pulse 73   Ht 5\' 3"  (1.6 m)   Wt 132 lb (59.9 kg)   SpO2 96%   BMI 23.38 kg/m     Wt Readings from Last 3 Encounters:  06/24/20 132 lb (59.9 kg)  10/10/19 145 lb (65.8 kg)  06/19/19 154 lb (69.9 kg)     Constitutional:      Appearance: Healthy appearance. Not in distress. Not toxic-appearing.  Neck:     Vascular: No JVR.  Pulmonary:     Effort: Pulmonary effort is normal.     Breath sounds: No wheezing. Rales (??crackles R base - cleared with cough) present.  Cardiovascular:     Normal rate. Regular rhythm. Normal S1. Normal S2.     Murmurs: There is no murmur.  Edema:    Peripheral edema absent.  Abdominal:     General: There is no distension.     Palpations: Abdomen is soft.  Musculoskeletal:     Cervical back: Neck supple. Skin:    General: Skin is warm and dry.  Neurological:     Mental Status: Alert and oriented to person, place and time.     Cranial Nerves: Cranial nerves are intact.  Psychiatric:        Mood and Affect: Affect normal.      ASSESSMENT & PLAN:    1. Orthostatic hypotension 2. Acute cystitis without hematuria 3. Shortness of breath Her blood pressure is running lower than usual.  I stood her in the clinic today and her blood pressure dropped from 425 systolic to 80 systolic.  I suspect that her underlying problem has been a UTI which has contributed to hypotension in the setting of empagliflozin therapy.  Her sugars have been a little bit higher at home which would coincide with her infection.  She describes some shortness of breath but this sounds more like weakness probably from hypotension.  She has had a mild cough.  There were some crackles in her right base that cleared with cough.  She has not had a Covid vaccine but has not had any nasal congestion, loss of taste or smell.   She is done to Covid test at home which have been negative.  Her O2 saturation is normal.  She does not have a primary care physician here.  She can do a virtual visit with her PCP in [place], CBC, TSH, BNP, urine culture  -Obtain chest x-ray  -Hold empagliflozin for the  next few days  -Push fluids, liberalize salt  -She knows to go to the emergency room if her symptoms worsen  -Follow-up with PCP via virtual visit in the next few days  -Arrange follow-up with PCP locally  4. Coronary artery disease involving native coronary artery of native heart without angina pectoris History of prior stenting to the RCA in 2016 in [place].  She is not really having anginal symptoms.  Continue aspirin, clopidogrel, rosuvastatin.  At this point, she does not seem to have any cardiac issues contributing to her symptoms.  I did obtain a BNP.  If this is significantly elevated, I will obtain an echocardiogram.  I will make sure she has follow-up with Lacey Kemp or me in a couple weeks as well.  5. Dyslipidemia, goal LDL below 70 LDL optimal on most recent lab work.  Continue current Rx.    6. Diabetes mellitus due to underlying condition with stage 1 chronic kidney disease, with long-term current use of insulin ([place]) As noted, I believe that empagliflozin is making her low blood pressure worse.  Holding this for a few days should help.  As noted I have also asked her to push fluids as well as to follow-up with her PCP to further manage her diabetes.    Dispo:  Return in about 2 weeks (around 07/08/2020) for Close Follow Up w/ Lacey Kemp or Richardson Dopp, PA-C, via Telemedicine.   Medication Adjustments/Labs and Tests Ordered: Current medicines are reviewed at length with the patient today.  Concerns regarding medicines are outlined above.  Tests Ordered: Orders Placed This Encounter  Procedures  . Urine Culture  . DG Chest 2 View  . CBC  . Comprehensive  metabolic panel  . TSH  . Pro b natriuretic peptide (BNP)  . EKG 12-Lead   Medication Changes: No orders of the defined types were placed in this encounter.   Signed, Richardson Dopp, PA-C  06/24/2020 11:24 AM    [facility] [address], [place], [place]  [address] Phone: [phone]; Fax: [phone]

## 2020-06-24 ENCOUNTER — Ambulatory Visit
Admission: RE | Admit: 2020-06-24 | Discharge: 2020-06-24 | Disposition: A | Discharge status: Home / Self Care | Payer: Medicare Other | Source: Ambulatory Visit | Attending: Physician Assistant | Admitting: Physician Assistant

## 2020-06-24 ENCOUNTER — Ambulatory Visit (INDEPENDENT_AMBULATORY_CARE_PROVIDER_SITE_OTHER): Payer: Medicare Other | Admitting: Physician Assistant

## 2020-06-24 ENCOUNTER — Other Ambulatory Visit: Payer: Self-pay

## 2020-06-24 ENCOUNTER — Encounter: Payer: Self-pay | Admitting: Physician Assistant

## 2020-06-24 VITALS — BP 100/44 | HR 73 | Ht 63.0 in | Wt 132.0 lb

## 2020-06-24 DIAGNOSIS — I2510 Atherosclerotic heart disease of native coronary artery without angina pectoris: Secondary | ICD-10-CM

## 2020-06-24 DIAGNOSIS — I951 Orthostatic hypotension: Secondary | ICD-10-CM

## 2020-06-24 DIAGNOSIS — N3000 Acute cystitis without hematuria: Secondary | ICD-10-CM | POA: Diagnosis not present

## 2020-06-24 DIAGNOSIS — R0602 Shortness of breath: Secondary | ICD-10-CM

## 2020-06-24 DIAGNOSIS — E0822 Diabetes mellitus due to underlying condition with diabetic chronic kidney disease: Secondary | ICD-10-CM

## 2020-06-24 DIAGNOSIS — Z794 Long term (current) use of insulin: Secondary | ICD-10-CM

## 2020-06-24 DIAGNOSIS — N181 Chronic kidney disease, stage 1: Secondary | ICD-10-CM

## 2020-06-24 DIAGNOSIS — E785 Hyperlipidemia, unspecified: Secondary | ICD-10-CM

## 2020-06-24 NOTE — Patient Instructions (Addendum)
Medication Instructions:  Your physician has recommended you make the following change in your medication:   1) Hold Synjardy for 3-4 days  *If you need a refill on your cardiac medications before your next appointment, please call your pharmacy*  Lab Work: You will have labs drawn today: CMET/BNP/CBC/TSH/urine culture  If you have labs (blood work) drawn today and your tests are completely normal, you will receive your results only by: Marland Kitchen MyChart Message (if you have MyChart) OR . A paper copy in the mail If you have any lab test that is abnormal or we need to change your treatment, we will call you to review the results.  Testing/Procedures: Chest X-ray Instructions:    1. You may have this done at the [facility], located in the [place] on the 1st floor.    2. You do no have to have an appointment.    3. [place], [place] [address]        [phone]        Monday - Friday  8:00 am - 5:00 pm  Follow-Up: On 07/08/20 at 11:45AM with Richardson Dopp, PA-C  Other Instructions Contact PCP within in the next 2-3 days for virtual visit. Increase fluids and salt intake. Be careful when going from sitting to standing, rise slowly.

## 2020-06-25 ENCOUNTER — Telehealth: Payer: Self-pay | Admitting: Cardiology

## 2020-06-25 LAB — CBC
Hematocrit: 39.3 % (ref 34.0–46.6)
Hemoglobin: 12.9 g/dL (ref 11.1–15.9)
MCH: 27.3 pg (ref 26.6–33.0)
MCHC: 32.8 g/dL (ref 31.5–35.7)
MCV: 83 fL (ref 79–97)
Platelets: 207 10*3/uL (ref 150–450)
RBC: 4.73 x10E6/uL (ref 3.77–5.28)
RDW: 14.2 % (ref 11.7–15.4)
WBC: 5.5 10*3/uL (ref 3.4–10.8)

## 2020-06-25 LAB — PRO B NATRIURETIC PEPTIDE: NT-Pro BNP: 128 pg/mL (ref 0–738)

## 2020-06-25 LAB — COMPREHENSIVE METABOLIC PANEL
ALT: 18 IU/L (ref 0–32)
AST: 36 IU/L (ref 0–40)
Albumin/Globulin Ratio: 1.5 (ref 1.2–2.2)
Albumin: 4.3 g/dL (ref 3.6–4.6)
Alkaline Phosphatase: 73 IU/L (ref 48–121)
BUN/Creatinine Ratio: 12 (ref 12–28)
BUN: 13 mg/dL (ref 8–27)
Bilirubin Total: 0.3 mg/dL (ref 0.0–1.2)
CO2: 22 mmol/L (ref 20–29)
Calcium: 9.6 mg/dL (ref 8.7–10.3)
Chloride: 98 mmol/L (ref 96–106)
Creatinine, Ser: 1.06 mg/dL — ABNORMAL HIGH (ref 0.57–1.00)
GFR calc Af Amer: 55 mL/min/{1.73_m2} — ABNORMAL LOW (ref >59)
GFR calc non Af Amer: 48 mL/min/{1.73_m2} — ABNORMAL LOW (ref >59)
Globulin, Total: 2.8 g/dL (ref 1.5–4.5)
Glucose: 178 mg/dL — ABNORMAL HIGH (ref 65–99)
Potassium: 4.1 mmol/L (ref 3.5–5.2)
Sodium: 136 mmol/L (ref 134–144)
Total Protein: 7.1 g/dL (ref 6.0–8.5)

## 2020-06-25 LAB — TSH: TSH: 7.89 u[IU]/mL — ABNORMAL HIGH (ref 0.450–4.500)

## 2020-06-25 NOTE — Telephone Encounter (Signed)
Lm to call back ./cy 

## 2020-06-25 NOTE — Telephone Encounter (Signed)
Patient's granddaughter states the patient is experiencing a sharp, stabbing pain on the right side of her head. She states the patient has also been extremely fatigued. Denies additional symptoms. Please return call to discuss.

## 2020-06-26 ENCOUNTER — Telehealth: Payer: Self-pay | Admitting: Physician Assistant

## 2020-06-26 DIAGNOSIS — E039 Hypothyroidism, unspecified: Secondary | ICD-10-CM

## 2020-06-26 LAB — URINE CULTURE

## 2020-06-26 NOTE — Telephone Encounter (Signed)
I spoke with patient's daughter.  She reports patient has been having sharp pain at times on the right side of her head.  Thinks it started 4-5 days ago but may have been earlier than this as patient has been staying with another daughter until recently.  Tylenol has helped.  She did not discuss this with Richardson Dopp, PA at recent office visit.  Patient is seeing new PCP this afternoon and I asked daughter to discuss this pain with PCP today

## 2020-06-26 NOTE — Telephone Encounter (Signed)
[name] calling for lab results.

## 2020-06-26 NOTE — Telephone Encounter (Signed)
Hemoglobin, K+, LFTs, BNP normal. Creatinine slightly elevated but overall stable. This may be related to some dehydration. TSH elevated. See chest x-ray results PLAN:  - Continue current management - High TSH may be related to acute illness - She will need a repeat TSH, Free T4 in 6-8 weeks. - If she has a PCP locally, she can get thyroid labs there. O/w, we can draw here. Lacey Dopp, PA-C   I spoke with patient's daughter and reviewed results with her.  Chest X ray results also reviewed. Daughter reports patient may have missed doses of thyroid medication recently.  She will make sure patient takes daily.  Daughter would like to have lab work done in our office.  Patient will come in for lab work on 08/12/20.   Patient is seeing new PCP this Lacey Rous, PA at [facility].  Will route lab results to PCP.  Daughter is asking about referral to [facility]. She has used [facility] in the past and prefers to use them again if possible. Daughter will check with PCP and see if they can make referral.  If not she will call back to see if this can be done through our office.

## 2020-06-27 ENCOUNTER — Telehealth: Payer: Self-pay | Admitting: Physician Assistant

## 2020-06-27 NOTE — Telephone Encounter (Signed)
Spoke with the patients daughter and encouraged her to contact her mothers PCP for COVID home health referrals but to call us back if any new cardiac symptoms present. Pts daughter states she is recovering well for the time being.

## 2020-06-27 NOTE — Telephone Encounter (Signed)
New message:     Patient daughter calling stating that her mother has covid and referral to [facility] to get assistantes for her mother care while she has covid. [place].

## 2020-06-28 ENCOUNTER — Other Ambulatory Visit (HOSPITAL_COMMUNITY): Payer: Self-pay | Admitting: Nurse Practitioner

## 2020-06-28 ENCOUNTER — Ambulatory Visit (HOSPITAL_COMMUNITY)
Admission: RE | Admit: 2020-06-28 | Discharge: 2020-06-28 | Disposition: A | Discharge status: Home / Self Care | Payer: Medicare Other | Source: Ambulatory Visit | Attending: Pulmonary Disease | Admitting: Pulmonary Disease

## 2020-06-28 DIAGNOSIS — U071 COVID-19: Secondary | ICD-10-CM | POA: Insufficient documentation

## 2020-06-28 DIAGNOSIS — Z23 Encounter for immunization: Secondary | ICD-10-CM | POA: Diagnosis not present

## 2020-06-28 MED ORDER — EPINEPHRINE 0.3 MG/0.3ML IJ SOAJ: 0.3000 mg | Freq: Once | INTRAMUSCULAR | Status: DC | PRN

## 2020-06-28 MED ORDER — DIPHENHYDRAMINE HCL 50 MG/ML IJ SOLN: 50.0000 mg | Freq: Once | INTRAMUSCULAR | Status: DC | PRN

## 2020-06-28 MED ORDER — SODIUM CHLORIDE 0.9 % IV SOLN: INTRAVENOUS | Status: DC | PRN

## 2020-06-28 MED ORDER — SODIUM CHLORIDE 0.9 % IV SOLN
1200.0000 mg | Freq: Once | INTRAVENOUS | Status: AC
  Administered 2020-06-28: 1200 mg via INTRAVENOUS
  Filled 2020-06-28: qty 10

## 2020-06-28 MED ORDER — ALBUTEROL SULFATE HFA 108 (90 BASE) MCG/ACT IN AERS: 2.0000 | INHALATION_SPRAY | Freq: Once | RESPIRATORY_TRACT | Status: DC | PRN

## 2020-06-28 MED ORDER — METHYLPREDNISOLONE SODIUM SUCC 125 MG IJ SOLR: 125.0000 mg | Freq: Once | INTRAMUSCULAR | Status: DC | PRN

## 2020-06-28 MED ORDER — FAMOTIDINE IN NACL 20-0.9 MG/50ML-% IV SOLN: 20.0000 mg | Freq: Once | INTRAVENOUS | Status: DC | PRN

## 2020-06-28 NOTE — Progress Notes (Signed)
  Diagnosis: COVID-19  Physician:Dr Joya Gaskins  Procedure: Covid Infusion Clinic Med: casirivimab\imdevimab infusion - Provided patient with casirivimab\imdevimab fact sheet for patients, parents and caregivers prior to infusion.  Complications: No immediate complications noted.  Discharge: Discharged home   [address] 06/28/2020

## 2020-06-28 NOTE — Discharge Instructions (Signed)

## 2020-06-28 NOTE — Progress Notes (Signed)
I connected by phone with Lacey Kemp on 06/28/2020 at 8:59 AM to discuss the potential use of an new treatment for mild to moderate COVID-19 viral infection in non-hospitalized patients.  This patient is a 86 y.o. female that meets the FDA criteria for Emergency Use Authorization of casirivimab\imdevimab.  Has a (+) direct SARS-CoV-2 viral test result  Has mild or moderate COVID-19   Is ? 84 years of age and weighs ? 40 kg  Is NOT hospitalized due to COVID-19  Is NOT requiring oxygen therapy or requiring an increase in baseline oxygen flow rate due to COVID-19  Is within 10 days of symptom onset  Has at least one of the high risk factor(s) for progression to severe COVID-19 and/or hospitalization as defined in EUA.  Specific high risk criteria : Older age (>/= 84 yo), CAD, DM  Sx onset 8/27. Unvaccinated.    I have spoken and communicated the following to the patient or parent/caregiver:  1. FDA has authorized the emergency use of casirivimab\imdevimab for the treatment of mild to moderate COVID-19 in adults and pediatric patients with positive results of direct SARS-CoV-2 viral testing who are 59 years of age and older weighing at least 40 kg, and who are at high risk for progressing to severe COVID-19 and/or hospitalization.  2. The significant known and potential risks and benefits of casirivimab\imdevimab, and the extent to which such potential risks and benefits are unknown.  3. Information on available alternative treatments and the risks and benefits of those alternatives, including clinical trials.  4. Patients treated with casirivimab\imdevimab should continue to self-isolate and use infection control measures (e.g., wear mask, isolate, social distance, avoid sharing personal items, clean and disinfect "high touch" surfaces, and frequent handwashing) according to CDC guidelines.   5. The patient or parent/caregiver has the option to accept or refuse casirivimab\imdevimab  .  After reviewing this information with the patient, The patient agreed to proceed with receiving casirivimab\imdevimab infusion and will be provided a copy of the Fact sheet prior to receiving the infusion.Beckey Rutter, [place], AGNP-C [phone] ([place])

## 2020-07-08 ENCOUNTER — Telehealth (INDEPENDENT_AMBULATORY_CARE_PROVIDER_SITE_OTHER): Payer: Medicare Other | Admitting: Physician Assistant

## 2020-07-08 ENCOUNTER — Telehealth: Payer: Self-pay | Admitting: *Deleted

## 2020-07-08 ENCOUNTER — Encounter: Payer: Self-pay | Admitting: Physician Assistant

## 2020-07-08 VITALS — BP 125/75 | HR 88 | Ht 63.0 in | Wt 132.0 lb

## 2020-07-08 DIAGNOSIS — U071 COVID-19: Secondary | ICD-10-CM

## 2020-07-08 DIAGNOSIS — I2510 Atherosclerotic heart disease of native coronary artery without angina pectoris: Secondary | ICD-10-CM | POA: Diagnosis not present

## 2020-07-08 NOTE — Telephone Encounter (Signed)
  Patient Consent for Virtual Visit         Lacey Kemp has provided verbal consent on 07/08/2020 for a virtual visit (video or telephone).   CONSENT FOR VIRTUAL VISIT FOR:  Lacey Kemp  By participating in this virtual visit I agree to the following:  I hereby voluntarily request, consent and authorize [facility] and its employed or contracted physicians, physician assistants, nurse practitioners or other licensed health care professionals (the Practitioner), to provide me with telemedicine health care services (the "Services") as deemed necessary by the treating Practitioner. I acknowledge and consent to receive the Services by the Practitioner via telemedicine. I understand that the telemedicine visit will involve communicating with the Practitioner through live audiovisual communication technology and the disclosure of certain medical information by electronic transmission. I acknowledge that I have been given the opportunity to request an in-person assessment or other available alternative prior to the telemedicine visit and am voluntarily participating in the telemedicine visit.  I understand that I have the right to withhold or withdraw my consent to the use of telemedicine in the course of my care at any time, without affecting my right to future care or treatment, and that the Practitioner or I may terminate the telemedicine visit at any time. I understand that I have the right to inspect all information obtained and/or recorded in the course of the telemedicine visit and may receive copies of available information for a reasonable fee.  I understand that some of the potential risks of receiving the Services via telemedicine include:  Marland Kitchen Delay or interruption in medical evaluation due to technological equipment failure or disruption; . Information transmitted may not be sufficient (e.g. poor resolution of images) to allow for appropriate medical decision making by the  Practitioner; and/or  . In rare instances, security protocols could fail, causing a breach of personal health information.  Furthermore, I acknowledge that it is my responsibility to provide information about my medical history, conditions and care that is complete and accurate to the best of my ability. I acknowledge that Practitioner's advice, recommendations, and/or decision may be based on factors not within their control, such as incomplete or inaccurate data provided by me or distortions of diagnostic images or specimens that may result from electronic transmissions. I understand that the practice of medicine is not an exact science and that Practitioner makes no warranties or guarantees regarding treatment outcomes. I acknowledge that a copy of this consent can be made available to me via my patient portal ([place]), or I can request a printed copy by calling the office of [facility].    I understand that my insurance will be billed for this visit.   I have read or had this consent read to me. . I understand the contents of this consent, which adequately explains the benefits and risks of the Services being provided via telemedicine.  . I have been provided ample opportunity to ask questions regarding this consent and the Services and have had my questions answered to my satisfaction. . I give my informed consent for the services to be provided through the use of telemedicine in my medical care

## 2020-07-08 NOTE — Patient Instructions (Addendum)
Medication Instructions:  Your physician recommends that you continue on your current medications as directed. Please refer to the Current Medication list given to you today.  *If you need a refill on your cardiac medications before your next appointment, please call your pharmacy*  Lab Work: None ordered today  If you have labs (blood work) drawn today and your tests are completely normal, you will receive your results only by: Marland Kitchen MyChart Message (if you have MyChart) OR . A paper copy in the mail If you have any lab test that is abnormal or we need to change your treatment, we will call you to review the results.  Testing/Procedures: None ordered today  Follow-Up: On 01/13/2021 at 2:00PM with Fransico Him, MD

## 2020-07-08 NOTE — Progress Notes (Signed)
Virtual Visit via Video Note   This visit type was conducted due to national recommendations for restrictions regarding the COVID-19 Pandemic (e.g. social distancing) in an effort to limit this patient's exposure and mitigate transmission in our community.  Due to her co-morbid illnesses, this patient is at least at moderate risk for complications without adequate follow up.  This format is felt to be most appropriate for this patient at this time.  All issues noted in this document were discussed and addressed.  A limited physical exam was performed with this format.  Please refer to the patient's chart for her consent to telehealth for [facility].  Video Connection Lost Video connection was lost at < 50% of the duration of this visit, at which time the remainder of the visit was completed via audio only.     Date:  07/08/2020   ID:  Lacey Kemp, DOB 09-Jan-1931, MRN [id number] The patient was identified using 2 identifiers.  Patient Location: Home Provider Location: Office/Clinic  PCP:  Johna Roles, PA  Cardiologist:  Fransico Him, MD   Electrophysiologist:  None   Evaluation Performed:  Follow-Up Visit  Chief Complaint:  Follow-up (dizziness)    Patient Profile: Lacey Kemp is a 86 y.o. female with:  Coronary artery disease   S/p NSTEMI 2016 >> DES to RCA ([place])  No beta-blocker due to bradycardia   Myoview 5/19: no ischemia  Echocardiogram 5/19 EF 60-65, Gr 1 DD  Diabetes mellitus   Hyperlipidemia   Hypothyroidism   Prior CV studies: Myoview 03/17/18 EF 92, no ischemia; Low Risk  Echocardiogram 03/17/18 EF EF 60-65, no RWMA, Gr 1 DD, mild LAE  Cardiac catheterization 01/27/15 Antionette Fairy, [place]) LM patent  LAD mid 80-90 LCx patent RCA mid 99 EF 60, inf HK PCI: Xience DES to RCA  History of Present Illness:    Lacey Kemp was last seen 06/24/2020.  She had symptoms of dizziness, shortness of breath as well as decreased  appetite and weight loss over the past several weeks.  Home test suggested a UTI.  Her PCP in [place] had called in Bactrim.  She had a 20 point blood pressure drop when I stood her up in the office.  I asked her to hold her empagliflozin and hydrate with fluids.  I also asked her to follow-up with her PCP.   A BNP and chest x-ray were both normal.  Her TSH was elevated but her hemoglobin was normal.  Creatinine was just minimally elevated.  Since that time, the patient contacted our office to notify us that she has been diagnosed with COVID-19.  She underwent casirivimab\imdevimab infusion 06/28/20.    She is seen for follow up.  Since the mAb tx, she is feeling much better.  She is not having chest pain, shortness of breath, dizziness.  She has not needed to use much O2.   She is enrolled in the home monitoring program.     Past Medical History:  Diagnosis Date  . Breast CA ([place]) 12/08/2015   S/p lumpectomy left breast  . Coronary artery disease    s/p NSTEMI with PCI of RCA  . Diabetes mellitus   . Dyslipidemia, goal LDL below 70   . Thyroid disease    Past Surgical History:  Procedure Laterality Date  . BREAST LUMPECTOMY    . CARDIAC CATHETERIZATION    . CORONARY ANGIOPLASTY     PCI of RCA  . stent implant  [id number]  Current Meds  Medication Sig  . albuterol (VENTOLIN HFA) 108 (90 Base) MCG/ACT inhaler Inhale 1-2 puffs into the lungs every 6 (six) hours as needed for wheezing or shortness of breath.   Marland Kitchen aspirin EC 81 MG tablet Take 81 mg by mouth daily.  . clopidogrel (PLAVIX) 75 MG tablet Take 75 mg by mouth daily.  . CVS D3 125 MCG (5000 UT) capsule Take 5,000 Units by mouth daily.  Marland Kitchen ELDERBERRY PO Take 1 tablespoon by mouth daily  . Empagliflozin-metFORMIN HCl ER (SYNJARDY XR) 25-1000 MG TB24 Take 25-1,000 mg by mouth daily.  Marland Kitchen ibuprofen (ADVIL) 200 MG tablet Take by mouth as needed.  Marland Kitchen levothyroxine (SYNTHROID) 75 MCG tablet Take 75 mcg by mouth daily with breakfast.    . ondansetron (ZOFRAN) 4 MG tablet Take 4 mg by mouth every 8 (eight) hours as needed.  . rosuvastatin (CRESTOR) 5 MG tablet Take 5 mg by mouth daily.  . sitaGLIPtin (JANUVIA) 50 MG tablet Take 50 mg by mouth daily.     Allergies:   Patient has no known allergies.   Social History   Tobacco Use  . Smoking status: Former Research scientist (life sciences)  . Smokeless tobacco: Never Used  Vaping Use  . Vaping Use: Never used  Substance Use Topics  . Alcohol use: No  . Drug use: No     Family Hx: The patient's family history includes Arrhythmia in her brother; Breast cancer in her sister; Emphysema in her mother; Prostate cancer in her father.  ROS:   Please see the history of present illness.        Labs/Other Tests and Data Reviewed:    EKG:  No ECG reviewed.  Recent Labs: 06/24/2020: ALT 18; BUN 13; Creatinine, Ser 1.06; Hemoglobin 12.9; NT-Pro BNP 128; Platelets 207; Potassium 4.1; Sodium 136; TSH 7.890   Recent Lipid Panel Lab Results  Component Value Date/Time   CHOL 156 10/15/2019 10:44 AM   TRIG 167 (H) 10/15/2019 10:44 AM   HDL 63 10/15/2019 10:44 AM   CHOLHDL 2.5 10/15/2019 10:44 AM   CHOLHDL 2.5 04/21/2012 06:25 AM   LDLCALC 65 10/15/2019 10:44 AM    Wt Readings from Last 3 Encounters:  07/08/20 132 lb (59.9 kg)  06/24/20 132 lb (59.9 kg)  10/10/19 145 lb (65.8 kg)     Objective:    Vital Signs:  BP 125/75   Pulse 88   Ht 5\' 3"  (1.6 m)   Wt 132 lb (59.9 kg)   BMI 23.38 kg/m    VITAL SIGNS:  reviewed  ASSESSMENT & PLAN:    1. COVID-19 She was recently seen for dizziness, low blood pressure and shortness of breath.  It seems as though she was suffering from a UTI.  Her work-up was fairly unremarkable.  Since that time, she was diagnosed with COVID-19.  She underwent monoclonal antibody treatment and is feeling much better.  Her pressure is stable.  No further cardiovascular testing is needed at this time.  2. Coronary artery disease involving native coronary artery of  native heart without angina pectoris History of stenting to the RCA in 2016.  She is not having anginal symptoms.  Continue aspirin, clopidogrel, rosuvastatin.  Follow-up with Dr. Radford Pax in 6 months.     Time:   Today, I have spent 7 minutes with the patient with telehealth technology discussing the above problems.     Medication Adjustments/Labs and Tests Ordered: Current medicines are reviewed at length with the patient today.  Concerns regarding  medicines are outlined above.   Tests Ordered: No orders of the defined types were placed in this encounter.   Medication Changes: No orders of the defined types were placed in this encounter.   Follow Up:  In Person in 6 month(s)  Signed, Richardson Dopp, PA-C  07/08/2020 12:32 PM    [facility]

## 2020-08-12 ENCOUNTER — Other Ambulatory Visit: Payer: Medicare Other

## 2021-01-13 ENCOUNTER — Ambulatory Visit (INDEPENDENT_AMBULATORY_CARE_PROVIDER_SITE_OTHER): Payer: Medicare Other | Admitting: Cardiology

## 2021-01-13 ENCOUNTER — Other Ambulatory Visit: Payer: Self-pay

## 2021-01-13 ENCOUNTER — Encounter: Payer: Self-pay | Admitting: Cardiology

## 2021-01-13 VITALS — BP 124/52 | HR 78 | Ht 63.0 in | Wt 130.8 lb

## 2021-01-13 DIAGNOSIS — I2510 Atherosclerotic heart disease of native coronary artery without angina pectoris: Secondary | ICD-10-CM | POA: Diagnosis not present

## 2021-01-13 DIAGNOSIS — E0822 Diabetes mellitus due to underlying condition with diabetic chronic kidney disease: Secondary | ICD-10-CM

## 2021-01-13 DIAGNOSIS — N181 Chronic kidney disease, stage 1: Secondary | ICD-10-CM

## 2021-01-13 DIAGNOSIS — E785 Hyperlipidemia, unspecified: Secondary | ICD-10-CM | POA: Diagnosis not present

## 2021-01-13 DIAGNOSIS — R0989 Other specified symptoms and signs involving the circulatory and respiratory systems: Secondary | ICD-10-CM

## 2021-01-13 DIAGNOSIS — Z794 Long term (current) use of insulin: Secondary | ICD-10-CM

## 2021-01-13 NOTE — Progress Notes (Signed)
Date:  01/13/2021   ID:  Lacey Kemp, DOB 12-06-1930, MRN [id number]   PCP:  Johna Roles, PA  Cardiologist:  Fransico Him, MD  Electrophysiologist:  None   Chief Complaint:  CAD, HLD  History of Present Illness:    Lacey Kemp is a 87 y.o. female with a hx of ASCAD with NSTEMI with stenosis of the RCA stent in [place]. Her symptoms at that time were heartburn that radiated into her jaw. She also has a history of DM, dyslipidemia and hypothyroidism.She is not on BB due to bradycardia.  She is here today for followup and is doing well.  She denies any chest pain or pressure, SOB, DOE, PND, orthopnea, LE edema, dizziness, palpitations or syncope. She is compliant with her meds and is tolerating meds with no SE.    Prior CV studies:   The following studies were reviewed today:  none  Past Medical History:  Diagnosis Date  . Breast CA ([place]) 12/08/2015   S/p lumpectomy left breast  . Coronary artery disease    s/p NSTEMI with PCI of RCA  . Diabetes mellitus   . Dyslipidemia, goal LDL below 70   . Thyroid disease    Past Surgical History:  Procedure Laterality Date  . BREAST LUMPECTOMY    . CARDIAC CATHETERIZATION    . CORONARY ANGIOPLASTY     PCI of RCA  . stent implant  [id number]     Current Meds  Medication Sig  . aspirin EC 81 MG tablet Take 81 mg by mouth daily.  . clopidogrel (PLAVIX) 75 MG tablet Take 75 mg by mouth daily.  . Empagliflozin-metFORMIN HCl ER 25-1000 MG TB24 Take 25-1,000 mg by mouth daily.  Marland Kitchen levothyroxine (SYNTHROID) 75 MCG tablet Take 75 mcg by mouth daily with breakfast.  . rosuvastatin (CRESTOR) 5 MG tablet Take 5 mg by mouth daily.  . sitaGLIPtin (JANUVIA) 50 MG tablet Take 50 mg by mouth daily.  . [DISCONTINUED] ELDERBERRY PO Take 1 tablespoon by mouth daily  . [DISCONTINUED] ibuprofen (ADVIL) 200 MG tablet Take by mouth as needed.  . [DISCONTINUED] ondansetron (ZOFRAN) 4 MG tablet Take 4 mg by mouth every 8 (eight) hours as  needed.     Allergies:   Patient has no known allergies.   Social History   Tobacco Use  . Smoking status: Former Research scientist (life sciences)  . Smokeless tobacco: Never Used  Vaping Use  . Vaping Use: Never used  Substance Use Topics  . Alcohol use: No  . Drug use: No     Family Hx: The patient's family history includes Arrhythmia in her brother; Breast cancer in her sister; Emphysema in her mother; Prostate cancer in her father.  ROS:   Please see the history of present illness.     All other systems reviewed and are negative.   Labs/Other Tests and Data Reviewed:    Recent Labs: 06/24/2020: ALT 18; BUN 13; Creatinine, Ser 1.06; Hemoglobin 12.9; NT-Pro BNP 128; Platelets 207; Potassium 4.1; Sodium 136; TSH 7.890   Recent Lipid Panel Lab Results  Component Value Date/Time   CHOL 156 10/15/2019 10:44 AM   TRIG 167 (H) 10/15/2019 10:44 AM   HDL 63 10/15/2019 10:44 AM   CHOLHDL 2.5 10/15/2019 10:44 AM   CHOLHDL 2.5 04/21/2012 06:25 AM   LDLCALC 65 10/15/2019 10:44 AM    Wt Readings from Last 3 Encounters:  01/13/21 130 lb 12.8 oz (59.3 kg)  07/08/20 132 lb (59.9 kg)  06/24/20 132 lb (59.9  kg)     Objective:    Vital Signs:  BP (!) 124/52   Pulse 78   Ht 5\' 3"  (1.6 m)   Wt 130 lb 12.8 oz (59.3 kg)   SpO2 97%   BMI 23.17 kg/m   GEN: Well nourished, well developed in no acute distress HEENT: Normal NECK: No JVD; No carotid bruits LYMPHATICS: No lymphadenopathy CARDIAC:RRR, no murmurs, rubs, gallops RESPIRATORY:  Crackles at right base ABDOMEN: Soft, non-tender, non-distended MUSCULOSKELETAL:  No edema; No deformity  SKIN: Warm and dry NEUROLOGIC:  Alert and oriented x 3 PSYCHIATRIC:  Normal affect    ASSESSMENT & PLAN:    1.  ASCAD -she is s/p remoteNSTEMIwith PCI to the RCA.   Leane Call 02/2018 showed no ischemia.  -she denies any anginal symptoms -continue ASA 81mg  daily, Plavix 75mg  daily and statin  2.  HLD -LDL goal < 70 -LDL was 67 in Oct  2021 -continue Crestor 5mg  daily  3.  Type 2 DM -followed by PCP -continue Januvia 50mg  daily and Empagliflozin-metformin 25-1000mg  daily  4.  Abnormal lung findings -she has crackles at right base that do not clear with cough -I will check a Cxray  Followup with me in 1 year  Medication Adjustments/Labs and Tests Ordered: Current medicines are reviewed at length with the patient today.  Concerns regarding medicines are outlined above.  Tests Ordered: No orders of the defined types were placed in this encounter.  Medication Changes: No orders of the defined types were placed in this encounter.   Disposition:  Follow up in 1 year(s)  Signed, Fransico Him, MD  01/13/2021 1:53 PM    [facility]

## 2021-01-13 NOTE — Patient Instructions (Signed)

## 2021-02-12 ENCOUNTER — Telehealth: Payer: Self-pay | Admitting: Cardiology

## 2021-02-12 NOTE — Telephone Encounter (Signed)
Pt c/o Shortness Of Breath: STAT if SOB developed within the last 24 hours or pt is noticeably SOB on the phone  1. Are you currently SOB (can you hear that pt is SOB on the phone)? A little bit from walking, cannot her it while she was on the phone  2. How long have you been experiencing SOB? 3 or 4 days  3. Are you SOB when sitting or when up moving around? Moving around  4. Are you currently experiencing any other symptoms? no  [name] states the patient has been having SOB while moving around. Patient states she is a little SOB, because she was walking, but did not sound audibly SOB. [name] states the patient is just not herself and also does not have much of an appetite.

## 2021-02-12 NOTE — Telephone Encounter (Signed)
RN returned call to patients daughter regarding patients shortness of breath. Daughter states that patient has been more tired over the last 3-4 days and appears to have increased SOB per daughter. Daughter states that simple task such as showering and walking have been causing shortness of breath. Daughter reports a "wheezing" sound when patient is ambulating as well. Patient denies any cough, fever, or edema. Patient has a Paramedic that is reading normal with a heart rate of 87. Patients current BP is 117/78. Daughter states patients blood sugar is 224 with her medication. Daughter wanted to reach out to Dr.Turner to ensure something did not need to be done from a cardiac standpoint. Daughter is also going to contact patients PCP regarding elevated blood sugars. RN advised I would send a message to Dr. Radford Pax to advise.

## 2021-02-12 NOTE — Telephone Encounter (Signed)
RN spoke to Daughter Precious Bard, to advise of Dr. Landis Gandy recommendation. Daughter verbalized understanding. RN instructed daughter to contact the office with any questions or concerns.

## 2021-03-18 ENCOUNTER — Ambulatory Visit: Payer: Medicare Other | Admitting: Cardiology

## 2021-05-05 ENCOUNTER — Ambulatory Visit (INDEPENDENT_AMBULATORY_CARE_PROVIDER_SITE_OTHER): Payer: Medicare Other | Admitting: Cardiology

## 2021-05-05 ENCOUNTER — Encounter: Payer: Self-pay | Admitting: Cardiology

## 2021-05-05 ENCOUNTER — Other Ambulatory Visit: Payer: Self-pay

## 2021-05-05 VITALS — BP 104/62 | HR 84 | Ht 63.0 in | Wt 133.2 lb

## 2021-05-05 DIAGNOSIS — E785 Hyperlipidemia, unspecified: Secondary | ICD-10-CM

## 2021-05-05 DIAGNOSIS — I2583 Coronary atherosclerosis due to lipid rich plaque: Secondary | ICD-10-CM

## 2021-05-05 DIAGNOSIS — E0822 Diabetes mellitus due to underlying condition with diabetic chronic kidney disease: Secondary | ICD-10-CM

## 2021-05-05 DIAGNOSIS — N181 Chronic kidney disease, stage 1: Secondary | ICD-10-CM

## 2021-05-05 DIAGNOSIS — I2510 Atherosclerotic heart disease of native coronary artery without angina pectoris: Secondary | ICD-10-CM | POA: Diagnosis not present

## 2021-05-05 DIAGNOSIS — R0600 Dyspnea, unspecified: Secondary | ICD-10-CM

## 2021-05-05 DIAGNOSIS — Z794 Long term (current) use of insulin: Secondary | ICD-10-CM

## 2021-05-05 DIAGNOSIS — R0609 Other forms of dyspnea: Secondary | ICD-10-CM

## 2021-05-05 MED ORDER — CLOPIDOGREL BISULFATE 75 MG PO TABS
75.0000 mg | ORAL_TABLET | Freq: Every day | ORAL | 1 refills | Status: DC
Start: 2021-05-05 — End: 2023-10-12

## 2021-05-05 MED ORDER — ROSUVASTATIN CALCIUM 5 MG PO TABS
5.0000 mg | ORAL_TABLET | Freq: Every day | ORAL | 3 refills | Status: DC
Start: 2021-05-05 — End: 2023-10-12

## 2021-05-05 NOTE — Progress Notes (Signed)
Date:  05/05/2021   ID:  Lacey Kemp, DOB Mar 09, 1931, MRN [id number]   PCP:  Lacey Roles, PA  Cardiologist:  Lacey Him, MD  Electrophysiologist:  None   Chief Complaint:  CAD, HLD  History of Present Illness:    Lacey Kemp is a 87 y.o. female with a hx of ASCAD with NSTEMI with stenosis of the RCA stent in [place]. Her symptoms at that time were heartburn that radiated into her jaw. She also has a history of DM, dyslipidemia and hypothyroidism.  She is not on BB due to bradycardia.  She is here today for followup.  She is here with her daughter today and for the past 3 months has been feeling very tired and SOB with minimal walking.   She denies any chest pain or pressure, PND, orthopnea, LE edema, dizziness, palpitations or syncope. She is compliant with her meds and is tolerating meds with no SE.    Prior CV studies:   The following studies were reviewed today:  EKG  Past Medical History:  Diagnosis Date   Breast CA ([place]) 12/08/2015   S/p lumpectomy left breast   Coronary artery disease    s/p NSTEMI with PCI of RCA   Diabetes mellitus    Dyslipidemia, goal LDL below 70    Thyroid disease    Past Surgical History:  Procedure Laterality Date   BREAST LUMPECTOMY     CARDIAC CATHETERIZATION     CORONARY ANGIOPLASTY     PCI of RCA   stent implant  [id number]     Current Meds  Medication Sig   aspirin EC 81 MG tablet Take 81 mg by mouth daily.   clopidogrel (PLAVIX) 75 MG tablet Take 75 mg by mouth daily.   Empagliflozin-metFORMIN HCl (SYNJARDY PO) Take 1 tablet by mouth daily.   levothyroxine (SYNTHROID) 75 MCG tablet Take 75 mcg by mouth daily with breakfast.   rosuvastatin (CRESTOR) 5 MG tablet Take 5 mg by mouth daily.   sitaGLIPtin (JANUVIA) 50 MG tablet Take 50 mg by mouth daily.     Allergies:   Patient has no known allergies.   Social History   Tobacco Use   Smoking status: Former    Pack years: 0.00   Smokeless tobacco: Never  Vaping Use    Vaping Use: Never used  Substance Use Topics   Alcohol use: No   Drug use: No     Family Hx: The patient's family history includes Arrhythmia in her brother; Breast cancer in her sister; Emphysema in her mother; Prostate cancer in her father.  ROS:   Please see the history of present illness.     All other systems reviewed and are negative.   Labs/Other Tests and Data Reviewed:    Recent Labs: 06/24/2020: ALT 18; BUN 13; Creatinine, Ser 1.06; Hemoglobin 12.9; NT-Pro BNP 128; Platelets 207; Potassium 4.1; Sodium 136; TSH 7.890   Recent Lipid Panel Lab Results  Component Value Date/Time   CHOL 156 10/15/2019 10:44 AM   TRIG 167 (H) 10/15/2019 10:44 AM   HDL 63 10/15/2019 10:44 AM   CHOLHDL 2.5 10/15/2019 10:44 AM   CHOLHDL 2.5 04/21/2012 06:25 AM   LDLCALC 65 10/15/2019 10:44 AM    Wt Readings from Last 3 Encounters:  05/05/21 133 lb 3.2 oz (60.4 kg)  01/13/21 130 lb 12.8 oz (59.3 kg)  07/08/20 132 lb (59.9 kg)     Objective:    Vital Signs:  BP 104/62   Pulse 84  Ht 5\' 3"  (1.6 m)   Wt 133 lb 3.2 oz (60.4 kg)   SpO2 97%   BMI 23.60 kg/m   GEN: Well nourished, well developed in no acute distress HEENT: Normal NECK: No JVD; No carotid bruits LYMPHATICS: No lymphadenopathy CARDIAC:RRR, no murmurs, rubs, gallops RESPIRATORY:  Clear to auscultation without rales, wheezing or rhonchi  ABDOMEN: Soft, non-tender, non-distended MUSCULOSKELETAL:  No edema; No deformity  SKIN: Warm and dry NEUROLOGIC:  Alert and oriented x 3 PSYCHIATRIC:  Normal affect    EKG was performed in the office today and showed NSR with inferior and anterior infarct and no ST changes ASSESSMENT & PLAN:    1.  ASCAD -she is s/p remote NSTEMI with PCI to the RCA.   Lacey Kemp 02/2018 showed no ischemia.  -she has not had any anginal symptoms but is now having DOE which is new for her over the past 3 months along with exertional fatigue which may be an anginal equvalent -I will get a  Lexiscan myoview to rule out ischemia -Shared Decision Making/Informed Consent The risks [chest pain, shortness of breath, cardiac arrhythmias, dizziness, blood pressure fluctuations, myocardial infarction, stroke/transient ischemic attack, nausea, vomiting, allergic reaction, radiation exposure, metallic taste sensation and life-threatening complications (estimated to be 1 in 10,000)], benefits (risk stratification, diagnosing coronary artery disease, treatment guidance) and alternatives of a nuclear stress test were discussed in detail with Lacey Kemp and she agrees to proceed. -Continue prescription drug management with ASA 81mg  daily, Plavix 75mg  daily and statin>refilled for 1 year   2.  HLD -LDL goal < 70 -LDL was 67 in Oct 2021 -Continue prescription drug management with Crestor 5mg  daiyk>>refilled for 1 year   3.  Type 2 DM -followed by PCP -I have personally reviewed and interpreted outside labs performed by patient's PCP which showed HbA1C 8%  -continue Januvia 50mg  daily and Empagliflozin-metformin 25-1000mg  daily  4.  DOE -unclear etiology -see #1 -will get 2D echo to assess LVF -check TSH, CMET, CBC, BNP and Cxray  Followup with me in 1 year  Medication Adjustments/Labs and Tests Ordered: Current medicines are reviewed at length with the patient today.  Concerns regarding medicines are outlined above.  Tests Ordered: Orders Placed This Encounter  Procedures   EKG 12-Lead    Medication Changes: No orders of the defined types were placed in this encounter.   Disposition:  Follow up in 1 year(s)  Signed, Lacey Him, MD  05/05/2021 9:16 AM    [facility]

## 2021-05-05 NOTE — Patient Instructions (Signed)
Medication Instructions:  Your physician recommends that you continue on your current medications as directed. Please refer to the Current Medication list given to you today.  *If you need a refill on your cardiac medications before your next appointment, please call your pharmacy*   Lab Work: TODAY: TSH, CBC, CMET, BNP If you have labs (blood work) drawn today and your tests are completely normal, you will receive your results only by: [place] (if you have MyChart) OR A paper copy in the mail If you have any lab test that is abnormal or we need to change your treatment, we will call you to review the results.   Testing/Procedures: Your physician has requested that you have an echocardiogram. Echocardiography is a painless test that uses sound waves to create images of your heart. It provides your doctor with information about the size and shape of your heart and how well your heart's chambers and valves are working. This procedure takes approximately one hour. There are no restrictions for this procedure.  Your physician has requested that you have a lexiscan myoview. For further information please visit [email or web address]. Please follow instruction sheet, as given.  Follow-Up: At [facility], you and your health needs are our priority.  As part of our continuing mission to provide you with exceptional heart care, we have created designated Provider Care Teams.  These Care Teams include your primary Cardiologist (physician) and Advanced Practice Providers (APPs -  Physician Assistants and Nurse Practitioners) who all work together to provide you with the care you need, when you need it.  Your next appointment:   1 year(s)  The format for your next appointment:   In Person  Provider:   You may see Fransico Him, MD or one of the following Advanced Practice Providers on your designated Care Team:   Melina Copa, PA-C Ermalinda Barrios, PA-C

## 2021-05-07 LAB — CBC
Hematocrit: 42.6 % (ref 34.0–46.6)
Hemoglobin: 13.7 g/dL (ref 11.1–15.9)
MCH: 27.7 pg (ref 26.6–33.0)
MCHC: 32.2 g/dL (ref 31.5–35.7)
MCV: 86 fL (ref 79–97)
Platelets: 237 10*3/uL (ref 150–450)
RBC: 4.95 x10E6/uL (ref 3.77–5.28)
RDW: 13.8 % (ref 11.7–15.4)
WBC: 8.7 10*3/uL (ref 3.4–10.8)

## 2021-05-07 LAB — COMPREHENSIVE METABOLIC PANEL
ALT: 10 IU/L (ref 0–32)
AST: 20 IU/L (ref 0–40)
Albumin/Globulin Ratio: 1.5 (ref 1.2–2.2)
Albumin: 5.7 g/dL — ABNORMAL HIGH (ref 3.6–4.6)
Alkaline Phosphatase: 128 IU/L — ABNORMAL HIGH (ref 44–121)
BUN/Creatinine Ratio: 17 (ref 12–28)
BUN: 23 mg/dL (ref 8–27)
Bilirubin Total: 0.2 mg/dL (ref 0.0–1.2)
CO2: 15 mmol/L — ABNORMAL LOW (ref 20–29)
Calcium: 12.2 mg/dL — ABNORMAL HIGH (ref 8.7–10.3)
Chloride: 101 mmol/L (ref 96–106)
Creatinine, Ser: 1.36 mg/dL — ABNORMAL HIGH (ref 0.57–1.00)
Globulin, Total: 3.7 g/dL (ref 1.5–4.5)
Glucose: 354 mg/dL — ABNORMAL HIGH (ref 65–99)
Potassium: 4.4 mmol/L (ref 3.5–5.2)
Sodium: 139 mmol/L (ref 134–144)
Total Protein: 9.4 g/dL — ABNORMAL HIGH (ref 6.0–8.5)
eGFR: 38 mL/min/{1.73_m2} — ABNORMAL LOW (ref >59)

## 2021-05-07 LAB — TSH: TSH: 6.92 u[IU]/mL — ABNORMAL HIGH (ref 0.450–4.500)

## 2021-05-07 LAB — PRO B NATRIURETIC PEPTIDE: NT-Pro BNP: 83 pg/mL (ref 0–738)

## 2021-05-15 ENCOUNTER — Telehealth (HOSPITAL_COMMUNITY): Payer: Self-pay | Admitting: *Deleted

## 2021-05-15 NOTE — Telephone Encounter (Signed)
[name] given detailed instructions per Myocardial Perfusion Study Information Sheet for the test on 05/22/21 at 7:15. Patient notified to arrive 15 minutes early and that it is imperative to arrive on time for appointment to keep from having the test rescheduled.  If you need to cancel or reschedule your appointment, please call the office within 24 hours of your appointment. . Patient verbalized understanding.Lacey Kemp

## 2021-05-22 ENCOUNTER — Other Ambulatory Visit (HOSPITAL_COMMUNITY): Payer: Medicare Other

## 2021-05-22 ENCOUNTER — Encounter (HOSPITAL_COMMUNITY): Payer: Medicare Other

## 2021-06-10 ENCOUNTER — Telehealth (HOSPITAL_COMMUNITY): Payer: Self-pay | Admitting: *Deleted

## 2021-06-10 NOTE — Telephone Encounter (Signed)
Left message on voicemail in reference to upcoming appointment scheduled for  06/17/21. Phone number given for a call back so details instructions can be given. Kirstie Peri

## 2021-06-15 ENCOUNTER — Telehealth (HOSPITAL_COMMUNITY): Payer: Self-pay | Admitting: *Deleted

## 2021-06-15 NOTE — Telephone Encounter (Signed)
Patient given detailed instructions per Myocardial Perfusion Study Information Sheet for the test on  06/17/21 Patient notified to arrive 15 minutes early and that it is imperative to arrive on time for appointment to keep from having the test rescheduled.  If you need to cancel or reschedule your appointment, please call the office within 24 hours of your appointment. . Patient verbalized understanding. Kirstie Peri

## 2021-06-17 ENCOUNTER — Other Ambulatory Visit (HOSPITAL_COMMUNITY): Payer: Medicare Other

## 2021-06-17 ENCOUNTER — Encounter (HOSPITAL_COMMUNITY): Payer: Medicare Other

## 2021-07-07 ENCOUNTER — Telehealth (HOSPITAL_COMMUNITY): Payer: Self-pay

## 2021-07-07 NOTE — Telephone Encounter (Signed)
Detailed instructions left on the patient's daughter per DPR. Detailed instructions left on her answering machine. Asked to call back with any questions. S.Aryanne Gilleland EMTP

## 2021-07-08 ENCOUNTER — Telehealth (HOSPITAL_COMMUNITY): Payer: Self-pay | Admitting: Cardiology

## 2021-07-08 NOTE — Telephone Encounter (Signed)
Patients daughter called and cancelled echo and Myoview for reason below:  07/08/2021 1:33 PM [id number], KAMIRA J  Cancel Rsn: Patient (Pts daughter says that she is feeling better and is concerned about her mom still having these procedures).  Order will be removed from the [place].

## 2021-07-09 ENCOUNTER — Ambulatory Visit (HOSPITAL_COMMUNITY): Payer: Medicare Other

## 2022-01-13 ENCOUNTER — Ambulatory Visit
Admission: RE | Admit: 2022-01-13 | Discharge: 2022-01-13 | Disposition: A | Discharge status: Home / Self Care | Payer: Medicare Other | Source: Ambulatory Visit | Attending: Physician Assistant | Admitting: Physician Assistant

## 2022-01-13 ENCOUNTER — Other Ambulatory Visit: Payer: Self-pay | Admitting: Physician Assistant

## 2022-01-13 DIAGNOSIS — R0989 Other specified symptoms and signs involving the circulatory and respiratory systems: Secondary | ICD-10-CM

## 2022-01-13 DIAGNOSIS — R051 Acute cough: Secondary | ICD-10-CM

## 2022-03-01 NOTE — Progress Notes (Signed)
?Cardiology Office Note:   ? ?Date:  03/03/2022  ? ?IDJonie Burdell, DOB Sep 08, 1931, MRN [id number] ? ?PCP:  Johna Roles, PA ?  ?[facility] Providers ?Cardiologist:  Fransico Him, MD    ? ?Referring MD: Johna Roles, PA  ? ?Chief Complaint: fatigue, dyspnea on exertion ? ?History of Present Illness:   ? ?Cherokee Boccio is a very pleasant 88 y.o. female with a hx of:  ? ? Coronary artery disease  ?S/p NSTEMI 2016 >> DES to RCA ([place]) ?No beta-blocker due to bradycardia  ?Myoview 5/19: no ischemia ?Echocardiogram 5/19 EF 60-65, Gr 1 DD ?Diabetes mellitus  ?Hyperlipidemia  ?Hypothyroidism  ?Breast cancer s/p lumpectomy left breast  ? ?She moved to [place] from [facility] to be near family in 2017. She reported hx of ASCAD with NSTEMI with stenosis of RCA stent 9 months prior. Symptoms were heartburn that radiated to her jaw.  ? ?She was last seen in our office by Dr. Radford Pax on 05/05/21 at which time she reported DOE over the previous 3 months along with exertional fatigue felt to be possible anginal equivalent. Lexiscan myoview was ordered to r/o ischemia and echo ordered to evaluated LVEF, however patient did not complete those tests. ? ?Today, she is here with her daughter with whom she lives. Daughter reports 1-2 months of severe fatigue. Patient would previously go all day without napping, now goes back to bed to nap several times per day.  Also has shortness of breath with activity. She is not very active and even less so now with taking several naps per day.  Symptoms of shortness of breath were reported to Dr. Radford Pax in July 2022 and cardiac testing was ordered, however daughter states patient seemed to improve at that time. Has lightheadedness with sudden movements, is careful to get up slowly. Appetite is poor and she is limited by bottom denture not fitting properly. She denies chest pain, lower extremity edema, palpitations, melena, hematuria, hemoptysis, diaphoresis, weakness,  presyncope, syncope, orthopnea, and PND. ? ?Past Medical History:  ?Diagnosis Date  ? Breast CA ([place]) 12/08/2015  ? S/p lumpectomy left breast  ? Coronary artery disease   ? s/p NSTEMI with PCI of RCA  ? Diabetes mellitus   ? Dyslipidemia, goal LDL below 70   ? Thyroid disease   ? ? ?Past Surgical History:  ?Procedure Laterality Date  ? BREAST LUMPECTOMY    ? CARDIAC CATHETERIZATION    ? CORONARY ANGIOPLASTY    ? PCI of RCA  ? stent implant  [id number]  ? ? ?Current Medications: ?Current Meds  ?Medication Sig  ? albuterol (VENTOLIN HFA) 108 (90 Base) MCG/ACT inhaler Inhale 2 puffs into the lungs every 4 (four) hours as needed.  ? Cholecalciferol (VITAMIN D3 ADULT GUMMIES) 25 MCG (1000 UT) CHEW as needed.  ? clopidogrel (PLAVIX) 75 MG tablet Take 1 tablet (75 mg total) by mouth daily.  ? levothyroxine (SYNTHROID) 75 MCG tablet Take 75 mcg by mouth daily with breakfast.  ? rosuvastatin (CRESTOR) 5 MG tablet Take 1 tablet (5 mg total) by mouth daily.  ? sitaGLIPtin (JANUVIA) 50 MG tablet Take 50 mg by mouth daily.  ?  ? ?Allergies:   Patient has no known allergies.  ? ?Social History  ? ?Socioeconomic History  ? Marital status: Widowed  ?  Spouse name: Not on file  ? Number of children: Not on file  ? Years of education: Not on file  ? Highest education level: Not on file  ?  Occupational History  ? Not on file  ?Tobacco Use  ? Smoking status: Former  ? Smokeless tobacco: Never  ?Vaping Use  ? Vaping Use: Never used  ?Substance and Sexual Activity  ? Alcohol use: No  ? Drug use: No  ? Sexual activity: Never  ?Other Topics Concern  ? Not on file  ?Social History Narrative  ? Not on file  ? ?Social Determinants of Health  ? ?Financial Resource Strain: Not on file  ?Food Insecurity: Not on file  ?Transportation Needs: Not on file  ?Physical Activity: Not on file  ?Stress: Not on file  ?Social Connections: Not on file  ?  ? ?Family History: ?The patient's family history includes Arrhythmia in her brother; Breast cancer in her  sister; Emphysema in her mother; Prostate cancer in her father. ? ?ROS:   ?Please see the history of present illness.    ?+ fatigue ?+ DOE ?All other systems reviewed and are negative. ? ?Labs/Other Studies Reviewed:   ? ?The following studies were reviewed today: ? ?Myoview 03/17/18 ?EF 92, no ischemia; Low Risk ?  ?Echocardiogram 03/17/18 ?EF EF 60-65, no RWMA, Gr 1 DD, mild LAE ?  ?Cardiac catheterization 01/27/15 [facility] [place], [place] ?LM patent  ?LAD mid 80-90 ?LCx patent ?RCA mid 99 ?EF 60, inf HK ?PCI: Xience DES to RCA ? ?Recent Labs: ?05/05/2021: ALT 10; BUN 23; Creatinine, Ser 1.36; Hemoglobin 13.7; NT-Pro BNP 83; Platelets 237; Potassium 4.4; Sodium 139; TSH 6.920  ?Recent Lipid Panel ?   ?Component Value Date/Time  ? CHOL 156 10/15/2019 1044  ? TRIG 167 (H) 10/15/2019 1044  ? HDL 63 10/15/2019 1044  ? CHOLHDL 2.5 10/15/2019 1044  ? CHOLHDL 2.5 04/21/2012 0625  ? VLDL 29 04/21/2012 0625  ? Quinn 65 10/15/2019 1044  ? ? ? ?Risk Assessment/Calculations:   ?  ?   ? ?Physical Exam:   ? ?VS:  BP 110/70   Pulse 88   Ht '5\' 3"'$  (1.6 m)   Wt 129 lb 9.6 oz (58.8 kg)   SpO2 98%   BMI 22.96 kg/m?    ? ?Wt Readings from Last 3 Encounters:  ?03/03/22 129 lb 9.6 oz (58.8 kg)  ?05/05/21 133 lb 3.2 oz (60.4 kg)  ?01/13/21 130 lb 12.8 oz (59.3 kg)  ?  ? ?GEN: Frail elderly female in no acute distress ?HEENT: Normal ?NECK: No JVD; No carotid bruits ?CARDIAC: RRR, no murmurs, rubs, gallops ?RESPIRATORY:  Clear to auscultation without rales, wheezing or rhonchi  ?ABDOMEN: Soft, non-tender, non-distended ?MUSCULOSKELETAL:  No edema; No deformity. 2+ pedal pulses, equal bilaterally ?SKIN: Warm and dry ?NEUROLOGIC:  Alert and oriented x 3 ?PSYCHIATRIC:  Normal affect  ? ?EKG:  EKG is ordered today.  The ekg ordered today demonstrates NSR at 88 bpm, TWI anterior leads, no acute change from previous ? ?Diagnoses:   ? ?1. Coronary artery disease involving native coronary artery of native heart without angina pectoris   ?2. DOE (dyspnea  on exertion)   ?3. Hyperlipidemia LDL goal <70   ?4. Other fatigue   ? ?Assessment and Plan:   ? ? ?CAD without angina: Prior stent to RCA 2016 in [place]. She denies chest pain. Has dyspnea on exertion, however she is not very active.  We discussed further testing for ischemia. Through shared this decision making with patient and daughter patient would like to forego any stress testing at this point.  Daughter does not feel that patient would be strong enough to undergo invasive procedure if  indicated. We will get echo to evaluate for worsening heart or valve function. GDMT of CAD has been limited by soft BP. Patient stopped her aspirin. Continue Plavix, statin.  ? ?DOE/Fatigue: Previously reported DOE in July 2022 with improvement. Currently experiencing symptoms of fatigue and DOE over past 1-2 months. I suspect there is some deconditioning and age-related overall decline.  We will update echo for worsening heart or valve function. Advised patient and daughter that soft BP would limit GDMT if heart function is abnormal. Encouraged her to take frequent walks around the house and yard and to use pedal exerciser to strengthen her legs. If echo is normal, would refer patient to PCP for management.  ? ?Hyperlipidemia LDL goal < 70: Daughter asked for discussion of risks and benefits of discontinuing Crestor. LDL 65 09/2019 at last check. Since patient is tolerating without specific concerns and has been taking for many years, would advise continuing Crestor. Do not feel it is necessary to continue checking lipids.  ? ?Medication management: I reviewed each of the medications and their purpose with patient and daughter.  No changes to medical therapy today. ? ? ?Disposition: 1 year with Dr. Radford Pax ? ? ?Medication Adjustments/Labs and Tests Ordered: ?Current medicines are reviewed at length with the patient today.  Concerns regarding medicines are outlined above.  ?Orders Placed This Encounter  ?Procedures  ? EKG 12-Lead  ?  ECHOCARDIOGRAM COMPLETE  ? ?No orders of the defined types were placed in this encounter. ? ? ?Patient Instructions  ?Medication Instructions:  ? ?Your physician recommends that you continue on your current medicatio

## 2022-03-03 ENCOUNTER — Ambulatory Visit (INDEPENDENT_AMBULATORY_CARE_PROVIDER_SITE_OTHER): Payer: Medicare Other | Admitting: Nurse Practitioner

## 2022-03-03 ENCOUNTER — Encounter: Payer: Self-pay | Admitting: Nurse Practitioner

## 2022-03-03 ENCOUNTER — Ambulatory Visit
Admission: RE | Admit: 2022-03-03 | Discharge: 2022-03-03 | Disposition: A | Discharge status: Home / Self Care | Payer: Medicare Other | Source: Ambulatory Visit | Attending: Physician Assistant | Admitting: Physician Assistant

## 2022-03-03 ENCOUNTER — Other Ambulatory Visit: Payer: Self-pay | Admitting: Physician Assistant

## 2022-03-03 VITALS — BP 110/70 | HR 88 | Ht 63.0 in | Wt 129.6 lb

## 2022-03-03 DIAGNOSIS — M546 Pain in thoracic spine: Secondary | ICD-10-CM

## 2022-03-03 DIAGNOSIS — R5383 Other fatigue: Secondary | ICD-10-CM

## 2022-03-03 DIAGNOSIS — M542 Cervicalgia: Secondary | ICD-10-CM

## 2022-03-03 DIAGNOSIS — Z79899 Other long term (current) drug therapy: Secondary | ICD-10-CM

## 2022-03-03 DIAGNOSIS — E785 Hyperlipidemia, unspecified: Secondary | ICD-10-CM

## 2022-03-03 DIAGNOSIS — I2510 Atherosclerotic heart disease of native coronary artery without angina pectoris: Secondary | ICD-10-CM | POA: Diagnosis not present

## 2022-03-03 DIAGNOSIS — R0609 Other forms of dyspnea: Secondary | ICD-10-CM | POA: Diagnosis not present

## 2022-03-03 NOTE — Patient Instructions (Signed)
Medication Instructions:  ? ?Your physician recommends that you continue on your current medications as directed. Please refer to the Current Medication list given to you today. ? ? ?*If you need a refill on your cardiac medications before your next appointment, please call your pharmacy* ? ? ?Lab Work: ? ?None ordered. ? ?If you have labs (blood work) drawn today and your tests are completely normal, you will receive your results only by: ?MyChart Message (if you have MyChart) OR ?A paper copy in the mail ?If you have any lab test that is abnormal or we need to change your treatment, we will call you to review the results. ? ? ?Testing/Procedures: ? ?Your physician has requested that you have an echocardiogram. Echocardiography is a painless test that uses sound waves to create images of your heart. It provides your doctor with information about the size and shape of your heart and how well your heart?s chambers and valves are working. This procedure takes approximately one hour. There are no restrictions for this procedure. ? ? ? ?Follow-Up: ?At [facility], you and your health needs are our priority.  As part of our continuing mission to provide you with exceptional heart care, we have created designated Provider Care Teams.  These Care Teams include your primary Cardiologist (physician) and Advanced Practice Providers (APPs -  Physician Assistants and Nurse Practitioners) who all work together to provide you with the care you need, when you need it. ? ?We recommend signing up for the patient portal called "MyChart".  Sign up information is provided on this After Visit Summary.  MyChart is used to connect with patients for Virtual Visits (Telemedicine).  Patients are able to view lab/test results, encounter notes, upcoming appointments, etc.  Non-urgent messages can be sent to your provider as well.   ?To learn more about what you can do with MyChart, go to [email or web address].   ? ?Your next appointment:    ?1 year(s) ? ?The format for your next appointment:   ?In Person ? ?Provider:   ?Fransico Him, MD   ? ? ?Other Instructions ? ? ?Your physician wants you to follow-up in: 1 year with Dr.Turner. You will receive a reminder letter in the mail two months in advance. If you don't receive a letter, please call our office to schedule the follow-up appointment. ? ? ?Important Information About Sugar ? ? ? ? ?  ?

## 2022-04-13 ENCOUNTER — Ambulatory Visit (HOSPITAL_COMMUNITY): Payer: Medicare Other

## 2023-01-28 ENCOUNTER — Emergency Department (HOSPITAL_COMMUNITY)
Admission: EM | Admit: 2023-01-28 | Discharge: 2023-01-29 | Disposition: A | Discharge status: Home / Self Care | Payer: Medicare Other | Attending: Emergency Medicine | Admitting: Emergency Medicine

## 2023-01-28 ENCOUNTER — Encounter (HOSPITAL_COMMUNITY): Payer: Self-pay

## 2023-01-28 ENCOUNTER — Emergency Department (HOSPITAL_COMMUNITY): Payer: Medicare Other

## 2023-01-28 DIAGNOSIS — W1830XA Fall on same level, unspecified, initial encounter: Secondary | ICD-10-CM | POA: Insufficient documentation

## 2023-01-28 DIAGNOSIS — S22080A Wedge compression fracture of T11-T12 vertebra, initial encounter for closed fracture: Secondary | ICD-10-CM | POA: Diagnosis not present

## 2023-01-28 DIAGNOSIS — M545 Low back pain, unspecified: Secondary | ICD-10-CM | POA: Insufficient documentation

## 2023-01-28 DIAGNOSIS — E039 Hypothyroidism, unspecified: Secondary | ICD-10-CM | POA: Diagnosis not present

## 2023-01-28 DIAGNOSIS — E1165 Type 2 diabetes mellitus with hyperglycemia: Secondary | ICD-10-CM | POA: Insufficient documentation

## 2023-01-28 DIAGNOSIS — Z7902 Long term (current) use of antithrombotics/antiplatelets: Secondary | ICD-10-CM | POA: Diagnosis not present

## 2023-01-28 DIAGNOSIS — R519 Headache, unspecified: Secondary | ICD-10-CM | POA: Insufficient documentation

## 2023-01-28 DIAGNOSIS — I2510 Atherosclerotic heart disease of native coronary artery without angina pectoris: Secondary | ICD-10-CM | POA: Diagnosis not present

## 2023-01-28 DIAGNOSIS — S299XXA Unspecified injury of thorax, initial encounter: Secondary | ICD-10-CM | POA: Diagnosis present

## 2023-01-28 LAB — BASIC METABOLIC PANEL
Anion gap: 9 (ref 5–15)
BUN: 10 mg/dL (ref 8–23)
CO2: 23 mmol/L (ref 22–32)
Calcium: 9.2 mg/dL (ref 8.9–10.3)
Chloride: 103 mmol/L (ref 98–111)
Creatinine, Ser: 0.83 mg/dL (ref 0.44–1.00)
GFR, Estimated: >60 mL/min (ref >60)
Glucose, Bld: 185 mg/dL — ABNORMAL HIGH (ref 70–99)
Potassium: 4.1 mmol/L (ref 3.5–5.1)
Sodium: 135 mmol/L (ref 135–145)

## 2023-01-28 LAB — CBC
HCT: 44.6 % (ref 36.0–46.0)
Hemoglobin: 14.4 g/dL (ref 12.0–15.0)
MCH: 28.5 pg (ref 26.0–34.0)
MCHC: 32.3 g/dL (ref 30.0–36.0)
MCV: 88.1 fL (ref 80.0–100.0)
Platelets: 212 10*3/uL (ref 150–400)
RBC: 5.06 MIL/uL (ref 3.87–5.11)
RDW: 14.3 % (ref 11.5–15.5)
WBC: 8 10*3/uL (ref 4.0–10.5)
nRBC: 0 % (ref 0.0–0.2)

## 2023-01-28 MED ORDER — HYDROCODONE-ACETAMINOPHEN 5-325 MG PO TABS
1.0000 | ORAL_TABLET | Freq: Once | ORAL | Status: AC
  Administered 2023-01-28: 1 via ORAL
  Filled 2023-01-28: qty 1

## 2023-01-28 NOTE — ED Triage Notes (Incomplete)
Pt

## 2023-01-28 NOTE — ED Provider Notes (Cosign Needed)
[place] EMERGENCY DEPARTMENT AT [facility] Provider Note   CSN: [id number] Arrival date & time: 01/28/23  2146     History  Chief Complaint  Patient presents with   Marletta Lor    Lacey Kemp is a 89 y.o. female with past medical history significant for hyperlipidemia, diabetes, hypothyroidism, coronary artery disease, osteoporosis, advanced age who presents with concern for fall on thinners.  Patient takes Plavix, she reports mechanical, nonsyncopal fall in her home.  Daughter reports that she was bending over to grab something when she suddenly lost her balance and fell.  She does report that she struck her head.  She complains of some low back pain.  She has a skin tear on the right arm.     Fall       Home Medications Prior to Admission medications   Medication Sig Start Date End Date Taking? Authorizing Provider  albuterol (VENTOLIN HFA) 108 (90 Base) MCG/ACT inhaler Inhale 2 puffs into the lungs every 4 (four) hours as needed. 01/13/22   [provider]  Cholecalciferol (VITAMIN D3 ADULT GUMMIES) 25 MCG (1000 UT) CHEW as needed.    [provider]  clopidogrel (PLAVIX) 75 MG tablet Take 1 tablet (75 mg total) by mouth daily. 05/05/21   Quintella Reichert, MD  levothyroxine (SYNTHROID) 75 MCG tablet Take 75 mcg by mouth daily with breakfast. 01/25/19   [provider]  rosuvastatin (CRESTOR) 5 MG tablet Take 1 tablet (5 mg total) by mouth daily. 05/05/21   Quintella Reichert, MD  sitaGLIPtin (JANUVIA) 50 MG tablet Take 50 mg by mouth daily.    [provider]      Allergies    Patient has no known allergies.    Review of Systems   Review of Systems  Skin:  Positive for wound.  All other systems reviewed and are negative.   Physical Exam Updated Vital Signs BP 120/74 Comment: Manual BP  Pulse 71   Temp 98.3 F (36.8 C)   Resp 16   Ht 5\' 3"  (1.6 m)   Wt 58 kg   SpO2 96%   BMI 22.65 kg/m  Physical Exam Vitals and nursing  note reviewed.  Constitutional:      General: She is not in acute distress.    Appearance: Normal appearance.  HENT:     Head: Normocephalic and atraumatic.  Eyes:     General:        Right eye: No discharge.        Left eye: No discharge.  Cardiovascular:     Rate and Rhythm: Normal rate and regular rhythm.     Pulses: Normal pulses.     Heart sounds: No murmur heard.    No friction rub. No gallop.  Pulmonary:     Effort: Pulmonary effort is normal.     Breath sounds: Normal breath sounds.  Abdominal:     General: Bowel sounds are normal.     Palpations: Abdomen is soft.  Musculoskeletal:     Comments: Patient was focal tenderness to palpation in the upper lumbar to lower thoracic spinal region.  She has intact strength 5/5 at least while bedbound to flexion, extension of the hips.  Pending evaluation of gait after her traumatic imaging, and TLSO brace  Skin:    General: Skin is warm and dry.     Capillary Refill: Capillary refill takes less than 2 seconds.  Neurological:     Mental Status: She is alert and  oriented to person, place, and time.     Comments: Cranial nerves II through XII grossly intact.  Intact finger-nose, intact heel-to-shin.   Alert and oriented x3.  Moves all 4 limbs spontaneously, normal coordination.  No pronator drift.  Intact strength 5 out of 5 bilateral upper and lower extremities.    Psychiatric:        Mood and Affect: Mood normal.        Behavior: Behavior normal.     ED Results / Procedures / Treatments   Labs (all labs ordered are listed, but only abnormal results are displayed) Labs Reviewed  BASIC METABOLIC PANEL - Abnormal; Notable for the following components:      Result Value   Glucose, Bld 185 (*)    All other components within normal limits  CBC    EKG EKG Interpretation  Date/Time:  Friday January 28 2023 21:59:18 EDT Ventricular Rate:  81 PR Interval:  154 QRS Duration: 80 QT Interval:  386 QTC Calculation: 448 R  Axis:   -25 Text Interpretation: Sinus rhythm Inferior infarct, old Anterior infarct, old No significant change since last tracing Confirmed by Linwood Dibbles [phone]) on 01/28/2023 10:09:20 PM  Radiology CT Lumbar Spine Wo Contrast  Result Date: 01/28/2023 CLINICAL DATA:  Trauma EXAM: CT LUMBAR SPINE WITHOUT CONTRAST TECHNIQUE: Multidetector CT imaging of the lumbar spine was performed without intravenous contrast administration. Multiplanar CT image reconstructions were also generated. RADIATION DOSE REDUCTION: This exam was performed according to the departmental dose-optimization program which includes automated exposure control, adjustment of the mA and/or kV according to patient size and/or use of iterative reconstruction technique. COMPARISON:  None Available. FINDINGS: Segmentation: 5 lumbar type vertebrae. Alignment: Normal. Vertebrae: The bones are diffusely osteopenic. There is an acute mild compression deformity of the superior endplate of T12 with 15% loss vertebral body height. There is no retropulsion of fracture fragments. No focal osseous lesions are seen. Paraspinal and other soft tissues: Negative. There are atherosclerotic calcifications of the aorta. Bilateral renal cysts are present measuring up to 5 cm. Disc levels: There is mild disc space narrowing and endplate osteophyte formation throughout the lumbar spine compatible with degenerative change. T12-L1: No central canal or neural foraminal stenosis. L1-L2: No central canal or neural foraminal stenosis. L2-L3: No central canal or neural foraminal stenosis. L3-L4: Mild disc bulge. Mild facet arthropathy. No central canal or neural foraminal stenosis. L4-L5: Bilateral facet arthropathy. No central canal or neural foraminal stenosis. L5-S1: Bilateral facet arthropathy. No central canal or neural foraminal stenosis. IMPRESSION: 1. Acute compression fracture of the superior endplate of T12 with 15% loss vertebral body height. No retropulsion of  fracture fragments. Aortic Atherosclerosis (ICD10-I70.0). Electronically Signed   By: Darliss Cheney M.D.   On: 01/28/2023 22:58   CT Head Wo Contrast  Result Date: 01/28/2023 CLINICAL DATA:  Recent fall with headaches and neck pain, initial encounter EXAM: CT HEAD WITHOUT CONTRAST CT CERVICAL SPINE WITHOUT CONTRAST TECHNIQUE: Multidetector CT imaging of the head and cervical spine was performed following the standard protocol without intravenous contrast. Multiplanar CT image reconstructions of the cervical spine were also generated. RADIATION DOSE REDUCTION: This exam was performed according to the departmental dose-optimization program which includes automated exposure control, adjustment of the mA and/or kV according to patient size and/or use of iterative reconstruction technique. COMPARISON:  04/20/2012 FINDINGS: CT HEAD FINDINGS Brain: No evidence of acute infarction, hemorrhage, hydrocephalus, extra-axial collection or mass lesion/mass effect. Chronic atrophic and ischemic changes are noted. Vascular: No hyperdense  vessel or unexpected calcification. Skull: Normal. Negative for fracture or focal lesion. Sinuses/Orbits: No acute finding. Other: None. CT CERVICAL SPINE FINDINGS Alignment: Within normal limits. Skull base and vertebrae: 7 cervical segments are well visualized. Vertebral body height is well maintained. No acute fracture or acute facet abnormality is noted. Osteophytic and facet hypertrophic changes are noted. The odontoid is within normal limits. Soft tissues and spinal canal: Surrounding soft tissue structures are within normal limits. Upper chest: Mild emphysematous changes are noted. Other: None IMPRESSION: CT of the head: Chronic atrophic and ischemic changes without acute abnormality. CT of cervical spine: Multilevel degenerative change without acute abnormality. Electronically Signed   By: Alcide Clever M.D.   On: 01/28/2023 22:51   CT Cervical Spine Wo Contrast  Result Date:  01/28/2023 CLINICAL DATA:  Recent fall with headaches and neck pain, initial encounter EXAM: CT HEAD WITHOUT CONTRAST CT CERVICAL SPINE WITHOUT CONTRAST TECHNIQUE: Multidetector CT imaging of the head and cervical spine was performed following the standard protocol without intravenous contrast. Multiplanar CT image reconstructions of the cervical spine were also generated. RADIATION DOSE REDUCTION: This exam was performed according to the departmental dose-optimization program which includes automated exposure control, adjustment of the mA and/or kV according to patient size and/or use of iterative reconstruction technique. COMPARISON:  04/20/2012 FINDINGS: CT HEAD FINDINGS Brain: No evidence of acute infarction, hemorrhage, hydrocephalus, extra-axial collection or mass lesion/mass effect. Chronic atrophic and ischemic changes are noted. Vascular: No hyperdense vessel or unexpected calcification. Skull: Normal. Negative for fracture or focal lesion. Sinuses/Orbits: No acute finding. Other: None. CT CERVICAL SPINE FINDINGS Alignment: Within normal limits. Skull base and vertebrae: 7 cervical segments are well visualized. Vertebral body height is well maintained. No acute fracture or acute facet abnormality is noted. Osteophytic and facet hypertrophic changes are noted. The odontoid is within normal limits. Soft tissues and spinal canal: Surrounding soft tissue structures are within normal limits. Upper chest: Mild emphysematous changes are noted. Other: None IMPRESSION: CT of the head: Chronic atrophic and ischemic changes without acute abnormality. CT of cervical spine: Multilevel degenerative change without acute abnormality. Electronically Signed   By: Alcide Clever M.D.   On: 01/28/2023 22:51   DG Forearm Right  Result Date: 01/28/2023 CLINICAL DATA:  Recent fall with forearm pain, initial encounter EXAM: RIGHT FOREARM - 2 VIEW COMPARISON:  None Available. FINDINGS: There is no evidence of fracture or other  focal bone lesions. Soft tissues are unremarkable. IMPRESSION: No acute abnormality noted. Electronically Signed   By: Alcide Clever M.D.   On: 01/28/2023 22:36   DG Chest Portable 1 View  Result Date: 01/28/2023 CLINICAL DATA:  Recent fall with chest pain, initial encounter EXAM: PORTABLE CHEST 1 VIEW COMPARISON:  01/13/2022 FINDINGS: Cardiac shadow is stable. Aortic calcifications are seen. Elevation of the right hemidiaphragm is noted increased when compared with the prior exam. No focal infiltrate or effusion is seen. Chronic fibrotic changes are noted stable from the previous exam. IMPRESSION: No acute abnormality noted. Electronically Signed   By: Alcide Clever M.D.   On: 01/28/2023 22:34   DG Pelvis Portable  Result Date: 01/28/2023 CLINICAL DATA:  Recent fall with pelvic pain, initial encounter EXAM: PORTABLE PELVIS 2 VIEWS COMPARISON:  None Available. FINDINGS: Pelvic ring is intact. Degenerative changes at the pubic symphysis are seen. No definitive fracture is seen. Vascular calcifications are noted. IMPRESSION: No definitive fracture is noted. Electronically Signed   By: Alcide Clever M.D.   On: 01/28/2023 22:33  Procedures Procedures    Medications Ordered in ED Medications  HYDROcodone-acetaminophen (NORCO/VICODIN) 5-325 MG per tablet 1 tablet (1 tablet Oral Given 01/28/23 2349)    ED Course/ Medical Decision Making/ A&P                             Medical Decision Making Amount and/or Complexity of Data Reviewed Labs: ordered. Radiology: ordered.  Risk Prescription drug management.   This patient is a 89 y.o. female  who presents to the ED for concern of fall, arm laceration, back pain, head injury on thinners.   Differential diagnoses prior to evaluation: The emergent differential diagnosis includes, but is not limited to,  acute fracture, dislocation, metabolic or other reason for fall including infection, cardiogenic or neurogenic syncope . This is not an exhaustive  differential.   Past Medical History / Co-morbidities: Diabetes, coronary artery disease, advanced age, osteoporosis, hyperlipidemia  Physical Exam: Physical exam performed. The pertinent findings include: Patient with some tenderness to palpation in the thoracic, upper lumbar spine, she has intact strength on my exam.  She has no sensory deficits.  She has no focal neurologic deficits.  She does have a skin tear of the right arm, no evidence of deeper laceration noted.  Lab Tests/Imaging studies: I personally interpreted labs/imaging and the pertinent results include: CBC unremarkable, BMP with mild hyperglycemia, glucose 185..  Independently interpreted plain films of the chest, pelvis, right forearm, as well as CT of the head, neck, and lumbar spine, imaging is notable only for a 50% loss of disc height in the T12 vertebra, no other acute fractures or significant imaging abnormalities noted.  I agree with the radiologist interpretation.  Cardiac monitoring: EKG obtained and interpreted by my attending physician which shows: Normal sinus rhythm, some evidence of old infarcts, but no significant change from last tracing   Medications: I ordered medication including Norco for pain control.  I have reviewed the patients home medicines and have made adjustments as needed.   Disposition: After consideration of the diagnostic results and the patients response to treatment, I feel that patient with new T12 compression fracture, she is being placed in TLSO brace, she is stable for discharge home as long as she is able to ambulate and control her pain, if unable to control pain or ambulate with TLSO I think that she would benefit from admission for pain control, PT/OT.   12:04 AM Care of Lacey Kemp transferred to [facility] and Dr. Madilyn Hook at the end of my shift as the patient will require reassessment once labs/imaging have resulted. Patient presentation, ED course, and plan of care discussed  with review of all pertinent labs and imaging. Please see his/her note for further details regarding further ED course and disposition. Plan at time of handoff is attempt to ambulate after TLSO and pain control, if patient can ambulate without difficulty she would like to be discharged home, she will need neurosurgical follow-up, if she is unable to ambulate without significant pain, I think that she would benefit from hospital admission for pain control, PT/OT. This may be altered or completely changed at the discretion of the oncoming team pending results of further workup.   Final Clinical Impression(s) / ED Diagnoses Final diagnoses:  None    Rx / DC Orders ED Discharge Orders     None         Olene Floss, PA-C 01/29/23 0004    Shonice Wrisley,  Edyth GunnelsChristian H, PA-C 01/29/23 0005    Linwood DibblesKnapp, Jon, MD 01/29/23 1500

## 2023-01-28 NOTE — Progress Notes (Signed)
   01/28/23 2129  Spiritual Encounters  Type of Visit Initial  Care provided to: Pt and family  Conversation partners present during encounter Nurse  Referral source Trauma page  Reason for visit Trauma  OnCall Visit Yes   Chap responded to Fall on Thinners call.  PT was oriented and friendly.  PT told Mirna Mires that her daughter was coming.  Mirna Mires located daughter in waiting area and conducted her to bedside.

## 2023-01-28 NOTE — ED Notes (Signed)
Medic Vitals, 142 palp 158/82, 89hr, 96% on room air, RR16, 175bgl  No IV access

## 2023-01-28 NOTE — ED Notes (Signed)
Pt was found to be on ground by daughter on the camera with unknonwn downtime. Pt has some linear time confusion but daughters states she is at baseline for her normal. Skin tear on the right arm, P had some tenderness on to top of her head, some reports of nausea, Pt has not had any nausea since medic tranport. No cervical spine tender, but some Thoracic Lumbar pain that is relieved with positioning

## 2023-01-29 DIAGNOSIS — S22080A Wedge compression fracture of T11-T12 vertebra, initial encounter for closed fracture: Secondary | ICD-10-CM | POA: Diagnosis not present

## 2023-01-29 MED ORDER — HYDROCODONE-ACETAMINOPHEN 5-325 MG PO TABS: 1.0000 | ORAL_TABLET | Freq: Four times a day (QID) | ORAL | 0 refills | Status: DC | PRN

## 2023-01-29 NOTE — Discharge Instructions (Addendum)
Where a back brace at all times, though you may remove it to shower/bathe.  Take Norco as prescribed for pain control.  This medication may make you drowsy.  Follow-up with neurosurgery for management of your compression fracture.  You may also follow-up with your primary doctor, if desired.  Return for any new or concerning symptoms.

## 2023-01-29 NOTE — ED Provider Notes (Signed)
1:05 AM TLSO brace applied.  Patient was able to briskly rise from her bed and take a few steps.  She does report that her discomfort has increased since application of the brace.  Received Norco a short time ago for pain control.  I confirmed with daughter, at bedside, that the patient lives with her.  She has an apartment in the basement of her daughter's home.  Daughter states that she does not work in his home most of the day.  Given preserved ambulation ability with good social support as an outpatient, feel the patient is appropriate for discharge to follow-up with neurosurgery.  I have also placed orders for a home health evaluation and face-to-face consultation should patient necessitate additional care while her fracture is healing. Patient and daughter agreeable to plan.   Antony Madura, PA-C 01/29/23 0134    Tilden Fossa, MD 01/29/23 [phone]

## 2023-01-29 NOTE — Progress Notes (Signed)
Orthopedic Tech Progress Note Patient Details:  Kaziyah Pignotti 1931/08/06 [id number]  Ortho Devices Type of Ortho Device: Thoracolumbar corset (TLSO) Ortho Device/Splint Location: back Ortho Device/Splint Interventions: Ordered, Application, Adjustment   Post Interventions Patient Tolerated: Fair Instructions Provided: Adjustment of device, Care of device, Poper ambulation with device  Leisa Gault 01/29/2023, 12:53 AM

## 2023-01-29 NOTE — ED Notes (Signed)
Pt able to ambulate with assistance and with walker with some pain

## 2023-10-08 ENCOUNTER — Inpatient Hospital Stay (HOSPITAL_COMMUNITY)
Admission: EM | Admit: 2023-10-08 | Discharge: 2023-10-12 | DRG: 281 | Disposition: A | Discharge status: Hospice | Payer: Medicare Other | Attending: Internal Medicine | Admitting: Internal Medicine

## 2023-10-08 ENCOUNTER — Encounter (HOSPITAL_COMMUNITY): Payer: Self-pay

## 2023-10-08 ENCOUNTER — Encounter (HOSPITAL_COMMUNITY)
Admission: EM | Disposition: A | Discharge status: Hospice | Payer: Self-pay | Source: Home / Self Care | Attending: Internal Medicine

## 2023-10-08 ENCOUNTER — Other Ambulatory Visit: Payer: Self-pay

## 2023-10-08 ENCOUNTER — Emergency Department (HOSPITAL_COMMUNITY): Payer: Medicare Other

## 2023-10-08 DIAGNOSIS — E039 Hypothyroidism, unspecified: Secondary | ICD-10-CM | POA: Diagnosis present

## 2023-10-08 DIAGNOSIS — Z853 Personal history of malignant neoplasm of breast: Secondary | ICD-10-CM | POA: Diagnosis not present

## 2023-10-08 DIAGNOSIS — I252 Old myocardial infarction: Secondary | ICD-10-CM

## 2023-10-08 DIAGNOSIS — E785 Hyperlipidemia, unspecified: Secondary | ICD-10-CM | POA: Diagnosis present

## 2023-10-08 DIAGNOSIS — Z825 Family history of asthma and other chronic lower respiratory diseases: Secondary | ICD-10-CM

## 2023-10-08 DIAGNOSIS — Z8042 Family history of malignant neoplasm of prostate: Secondary | ICD-10-CM

## 2023-10-08 DIAGNOSIS — Z515 Encounter for palliative care: Secondary | ICD-10-CM | POA: Diagnosis not present

## 2023-10-08 DIAGNOSIS — Z79890 Hormone replacement therapy (postmenopausal): Secondary | ICD-10-CM

## 2023-10-08 DIAGNOSIS — Z87891 Personal history of nicotine dependence: Secondary | ICD-10-CM | POA: Diagnosis not present

## 2023-10-08 DIAGNOSIS — L8990 Pressure ulcer of unspecified site, unspecified stage: Secondary | ICD-10-CM | POA: Insufficient documentation

## 2023-10-08 DIAGNOSIS — I2111 ST elevation (STEMI) myocardial infarction involving right coronary artery: Principal | ICD-10-CM

## 2023-10-08 DIAGNOSIS — J101 Influenza due to other identified influenza virus with other respiratory manifestations: Secondary | ICD-10-CM | POA: Diagnosis present

## 2023-10-08 DIAGNOSIS — E119 Type 2 diabetes mellitus without complications: Secondary | ICD-10-CM | POA: Diagnosis present

## 2023-10-08 DIAGNOSIS — Z7189 Other specified counseling: Secondary | ICD-10-CM

## 2023-10-08 DIAGNOSIS — E440 Moderate protein-calorie malnutrition: Secondary | ICD-10-CM | POA: Diagnosis present

## 2023-10-08 DIAGNOSIS — I2119 ST elevation (STEMI) myocardial infarction involving other coronary artery of inferior wall: Secondary | ICD-10-CM | POA: Diagnosis present

## 2023-10-08 DIAGNOSIS — Z66 Do not resuscitate: Secondary | ICD-10-CM | POA: Diagnosis present

## 2023-10-08 DIAGNOSIS — Z7984 Long term (current) use of oral hypoglycemic drugs: Secondary | ICD-10-CM | POA: Diagnosis not present

## 2023-10-08 DIAGNOSIS — Z681 Body mass index (BMI) 19 or less, adult: Secondary | ICD-10-CM | POA: Diagnosis not present

## 2023-10-08 DIAGNOSIS — Z7902 Long term (current) use of antithrombotics/antiplatelets: Secondary | ICD-10-CM

## 2023-10-08 DIAGNOSIS — I213 ST elevation (STEMI) myocardial infarction of unspecified site: Secondary | ICD-10-CM | POA: Diagnosis present

## 2023-10-08 DIAGNOSIS — Z79899 Other long term (current) drug therapy: Secondary | ICD-10-CM

## 2023-10-08 DIAGNOSIS — R64 Cachexia: Secondary | ICD-10-CM | POA: Diagnosis present

## 2023-10-08 DIAGNOSIS — E7800 Pure hypercholesterolemia, unspecified: Secondary | ICD-10-CM | POA: Diagnosis not present

## 2023-10-08 DIAGNOSIS — J111 Influenza due to unidentified influenza virus with other respiratory manifestations: Secondary | ICD-10-CM

## 2023-10-08 DIAGNOSIS — R627 Adult failure to thrive: Secondary | ICD-10-CM | POA: Diagnosis present

## 2023-10-08 DIAGNOSIS — Z7982 Long term (current) use of aspirin: Secondary | ICD-10-CM | POA: Diagnosis not present

## 2023-10-08 DIAGNOSIS — I2510 Atherosclerotic heart disease of native coronary artery without angina pectoris: Secondary | ICD-10-CM | POA: Diagnosis present

## 2023-10-08 DIAGNOSIS — F03A0 Unspecified dementia, mild, without behavioral disturbance, psychotic disturbance, mood disturbance, and anxiety: Secondary | ICD-10-CM | POA: Diagnosis present

## 2023-10-08 DIAGNOSIS — Z955 Presence of coronary angioplasty implant and graft: Secondary | ICD-10-CM | POA: Diagnosis not present

## 2023-10-08 DIAGNOSIS — Z1152 Encounter for screening for COVID-19: Secondary | ICD-10-CM

## 2023-10-08 DIAGNOSIS — L89151 Pressure ulcer of sacral region, stage 1: Secondary | ICD-10-CM | POA: Diagnosis present

## 2023-10-08 DIAGNOSIS — Z803 Family history of malignant neoplasm of breast: Secondary | ICD-10-CM

## 2023-10-08 LAB — COMPREHENSIVE METABOLIC PANEL
ALT: 9 U/L (ref 0–44)
AST: 28 U/L (ref 15–41)
Albumin: 3.3 g/dL — ABNORMAL LOW (ref 3.5–5.0)
Alkaline Phosphatase: 68 U/L (ref 38–126)
Anion gap: 14 (ref 5–15)
BUN: 13 mg/dL (ref 8–23)
CO2: 20 mmol/L — ABNORMAL LOW (ref 22–32)
Calcium: 9 mg/dL (ref 8.9–10.3)
Chloride: 100 mmol/L (ref 98–111)
Creatinine, Ser: 0.83 mg/dL (ref 0.44–1.00)
GFR, Estimated: >60 mL/min (ref >60)
Glucose, Bld: 147 mg/dL — ABNORMAL HIGH (ref 70–99)
Potassium: 4.2 mmol/L (ref 3.5–5.1)
Sodium: 134 mmol/L — ABNORMAL LOW (ref 135–145)
Total Bilirubin: 0.7 mg/dL (ref <1.2)
Total Protein: 6.7 g/dL (ref 6.5–8.1)

## 2023-10-08 LAB — HEMOGLOBIN A1C
Hgb A1c MFr Bld: 6.6 % — ABNORMAL HIGH (ref 4.8–5.6)
Mean Plasma Glucose: 142.72 mg/dL

## 2023-10-08 LAB — CBC WITH DIFFERENTIAL/PLATELET
Abs Immature Granulocytes: 0.05 10*3/uL (ref 0.00–0.07)
Basophils Absolute: 0 10*3/uL (ref 0.0–0.1)
Basophils Relative: 0 %
Eosinophils Absolute: 0 10*3/uL (ref 0.0–0.5)
Eosinophils Relative: 0 %
HCT: 42 % (ref 36.0–46.0)
Hemoglobin: 13.7 g/dL (ref 12.0–15.0)
Immature Granulocytes: 1 %
Lymphocytes Relative: 17 %
Lymphs Abs: 1.8 10*3/uL (ref 0.7–4.0)
MCH: 27.7 pg (ref 26.0–34.0)
MCHC: 32.6 g/dL (ref 30.0–36.0)
MCV: 85 fL (ref 80.0–100.0)
Monocytes Absolute: 0.6 10*3/uL (ref 0.1–1.0)
Monocytes Relative: 6 %
Neutro Abs: 8 10*3/uL — ABNORMAL HIGH (ref 1.7–7.7)
Neutrophils Relative %: 76 %
Platelets: 211 10*3/uL (ref 150–400)
RBC: 4.94 MIL/uL (ref 3.87–5.11)
RDW: 14.2 % (ref 11.5–15.5)
WBC: 10.6 10*3/uL — ABNORMAL HIGH (ref 4.0–10.5)
nRBC: 0 % (ref 0.0–0.2)

## 2023-10-08 LAB — RESP PANEL BY RT-PCR (RSV, FLU A&B, COVID)  RVPGX2
Influenza A by PCR: POSITIVE — AB
Influenza B by PCR: NEGATIVE
Resp Syncytial Virus by PCR: NEGATIVE
SARS Coronavirus 2 by RT PCR: NEGATIVE

## 2023-10-08 LAB — LIPID PANEL
Cholesterol: 119 mg/dL (ref 0–200)
HDL: 52 mg/dL (ref >40)
LDL Cholesterol: 52 mg/dL (ref 0–99)
Total CHOL/HDL Ratio: 2.3 {ratio}
Triglycerides: 74 mg/dL (ref <150)
VLDL: 15 mg/dL (ref 0–40)

## 2023-10-08 LAB — TROPONIN I (HIGH SENSITIVITY)
Troponin I (High Sensitivity): 1023 ng/L (ref <18)
Troponin I (High Sensitivity): 20223 ng/L (ref <18)

## 2023-10-08 LAB — PROTIME-INR
INR: 1.2 (ref 0.8–1.2)
Prothrombin Time: 15 s (ref 11.4–15.2)

## 2023-10-08 LAB — GLUCOSE, CAPILLARY
Glucose-Capillary: 148 mg/dL — ABNORMAL HIGH (ref 70–99)
Glucose-Capillary: 128 mg/dL — ABNORMAL HIGH (ref 70–99)

## 2023-10-08 LAB — I-STAT CG4 LACTIC ACID, ED: Lactic Acid, Venous: 1.2 mmol/L (ref 0.5–1.9)

## 2023-10-08 LAB — APTT: aPTT: 30 s (ref 24–36)

## 2023-10-08 SURGERY — CORONARY/GRAFT ACUTE MI REVASCULARIZATION
Anesthesia: LOCAL

## 2023-10-08 MED ORDER — HYDROCODONE-ACETAMINOPHEN 5-325 MG PO TABS
1.0000 | ORAL_TABLET | Freq: Four times a day (QID) | ORAL | Status: DC | PRN
  Administered 2023-10-09: 1 via ORAL
  Filled 2023-10-08 (×2): qty 1

## 2023-10-08 MED ORDER — HEPARIN BOLUS VIA INFUSION
3550.0000 [IU] | Freq: Once | INTRAVENOUS | Status: AC
Start: 2023-10-08 — End: 2023-10-08
  Administered 2023-10-08: 3550 [IU] via INTRAVENOUS
  Filled 2023-10-08: qty 3550

## 2023-10-08 MED ORDER — INSULIN ASPART 100 UNIT/ML IJ SOLN
0.0000 [IU] | Freq: Three times a day (TID) | INTRAMUSCULAR | Status: DC
  Administered 2023-10-08: 1 [IU] via SUBCUTANEOUS
  Administered 2023-10-09: 3 [IU] via SUBCUTANEOUS
  Administered 2023-10-09 – 2023-10-10 (×3): 1 [IU] via SUBCUTANEOUS

## 2023-10-08 MED ORDER — ASPIRIN 81 MG PO CHEW
324.0000 mg | CHEWABLE_TABLET | Freq: Once | ORAL | Status: AC
  Administered 2023-10-08: 324 mg via ORAL
  Filled 2023-10-08: qty 4

## 2023-10-08 MED ORDER — LINAGLIPTIN 5 MG PO TABS
5.0000 mg | ORAL_TABLET | Freq: Every day | ORAL | Status: DC
  Administered 2023-10-08: 5 mg via ORAL
  Filled 2023-10-08: qty 1

## 2023-10-08 MED ORDER — SODIUM CHLORIDE 0.9 % IV SOLN
INTRAVENOUS | Status: AC
  Administered 2023-10-08: 20 mL via INTRAVENOUS

## 2023-10-08 MED ORDER — ALBUTEROL SULFATE (2.5 MG/3ML) 0.083% IN NEBU: 2.5000 mg | INHALATION_SOLUTION | RESPIRATORY_TRACT | Status: DC | PRN

## 2023-10-08 MED ORDER — CLOPIDOGREL BISULFATE 75 MG PO TABS
75.0000 mg | ORAL_TABLET | Freq: Every day | ORAL | Status: DC
Start: 2023-10-08 — End: 2023-10-11
  Administered 2023-10-08 – 2023-10-09 (×2): 75 mg via ORAL
  Filled 2023-10-08 (×2): qty 1

## 2023-10-08 MED ORDER — ORAL CARE MOUTH RINSE: 15.0000 mL | OROMUCOSAL | Status: DC | PRN

## 2023-10-08 MED ORDER — LEVOTHYROXINE SODIUM 75 MCG PO TABS
75.0000 ug | ORAL_TABLET | Freq: Every day | ORAL | Status: DC
Start: 2023-10-09 — End: 2023-10-10
  Administered 2023-10-09 – 2023-10-10 (×2): 75 ug via ORAL
  Filled 2023-10-08 (×2): qty 1

## 2023-10-08 MED ORDER — ONDANSETRON HCL 4 MG/2ML IJ SOLN: 4.0000 mg | Freq: Four times a day (QID) | INTRAMUSCULAR | Status: DC | PRN

## 2023-10-08 MED ORDER — ACETAMINOPHEN 325 MG PO TABS
650.0000 mg | ORAL_TABLET | ORAL | Status: DC | PRN
  Administered 2023-10-09 (×2): 650 mg via ORAL
  Filled 2023-10-08 (×2): qty 2

## 2023-10-08 MED ORDER — HEPARIN SODIUM (PORCINE) 5000 UNIT/ML IJ SOLN
60.0000 [IU]/kg | Freq: Once | INTRAMUSCULAR | Status: DC
  Filled 2023-10-08: qty 1

## 2023-10-08 MED ORDER — METOPROLOL TARTRATE 25 MG PO TABS
25.0000 mg | ORAL_TABLET | Freq: Two times a day (BID) | ORAL | Status: DC
  Administered 2023-10-08 – 2023-10-09 (×3): 25 mg via ORAL
  Filled 2023-10-08 (×4): qty 1

## 2023-10-08 MED ORDER — NITROGLYCERIN 0.4 MG SL SUBL: 0.4000 mg | SUBLINGUAL_TABLET | SUBLINGUAL | Status: DC | PRN

## 2023-10-08 MED ORDER — ROSUVASTATIN CALCIUM 5 MG PO TABS
10.0000 mg | ORAL_TABLET | Freq: Every day | ORAL | Status: DC
  Administered 2023-10-08 – 2023-10-09 (×2): 10 mg via ORAL
  Filled 2023-10-08 (×2): qty 2

## 2023-10-08 MED ORDER — HEPARIN (PORCINE) 25000 UT/250ML-% IV SOLN
800.0000 [IU]/h | INTRAVENOUS | Status: DC
Start: 2023-10-08 — End: 2023-10-10
  Administered 2023-10-08: 700 [IU]/h via INTRAVENOUS
  Administered 2023-10-09: 650 [IU]/h via INTRAVENOUS
  Filled 2023-10-08: qty 250

## 2023-10-08 NOTE — ED Triage Notes (Signed)
Pt present to ED from home with c/o flu like symptoms. Per EMS, pt began to c/o chest pain upon entering EMS. EMS attempted to give 324mg  aspirin by mouth but pt began to choke on 162mg  aspirin. Pt oriented to name only at this time.

## 2023-10-08 NOTE — Plan of Care (Signed)
  Problem: Coping: Goal: Ability to adjust to condition or change in health will improve Outcome: Progressing   Problem: Metabolic: Goal: Ability to maintain appropriate glucose levels will improve Outcome: Progressing   Problem: Nutritional: Goal: Maintenance of adequate nutrition will improve Outcome: Progressing Goal: Progress toward achieving an optimal weight will improve Outcome: Progressing   Problem: Tissue Perfusion: Goal: Adequacy of tissue perfusion will improve Outcome: Progressing   Problem: Education: Goal: Knowledge of General Education information will improve Description: Including pain rating scale, medication(s)/side effects and non-pharmacologic comfort measures Outcome: Progressing

## 2023-10-08 NOTE — Progress Notes (Signed)
PHARMACY - ANTICOAGULATION CONSULT NOTE  Pharmacy Consult for heparin infusion Indication: chest pain/ACS  No Known Allergies  Patient Measurements: Height: 5\' 3"  (160 cm) Weight: 59 kg (130 lb) IBW/kg (Calculated) : 52.4 Heparin Dosing Weight: 59 kg  Vital Signs: Temp: 97.7 F (36.5 C) (12/14 1400) Temp Source: Oral (12/14 1400) BP: 129/74 (12/14 1415) Pulse Rate: 87 (12/14 1416)  Labs: Recent Labs    10/08/23 1350  HGB 13.7  HCT 42.0  PLT 211  APTT 30  LABPROT 15.0  INR 1.2    CrCl cannot be calculated (Patient's most recent lab result is older than the maximum 21 days allowed.).   Medical History: Past Medical History:  Diagnosis Date   Breast CA (HCC) 12/08/2015   S/p lumpectomy left breast   Coronary artery disease    s/p NSTEMI with PCI of RCA   Diabetes mellitus    Dyslipidemia, goal LDL below 70    Thyroid disease     Medications:  (Not in a hospital admission)   Assessment: 89 yo F presents to ED with c/o flu like symptoms and chest pain. ECG consistent with acute inferior STEMI. Decision was made to not undergo intervention and pursue medical management only. Pt does not take any anticoagulation at home. Pharmacy consulted to dose heparin infusion per ACS protocol.   Hgb 13.7, Plt 211 No s/sx of bleeding  Goal of Therapy:  Heparin level 0.3-0.7 units/ml Monitor platelets by anticoagulation protocol: Yes   Plan:  Give heparin 3550 units IV bolus from infusion x1, then  Initiate heparin infusion at 700 units/hr Check heparin level in 8 hours Monitor daily CBC, heparin level, and for s/sx of bleeding F/u cardiology recs for duration of heparin therapy   Wilburn Cornelia, PharmD, BCPS Clinical Pharmacist 10/08/2023 3:29 PM   Please refer to [facility] for pharmacy phone number

## 2023-10-08 NOTE — Consult Note (Signed)
Cardiology Consultation   Patient ID: Lacey Kemp MRN: [id number]; DOB: 12/11/1930  Admit date: 10/08/2023 Date of Consult: 10/08/2023  PCP:  Delma Officer, PA   [facility] Providers Cardiologist:  Armanda Magic, MD   {     History of Present Illness:   [name] is a 89 year old female with history of breast cancer, CAD with prior stenting of the RCA in 2016, DM, HLD, frailty and mild dementia who presented to the [facility] ED by EMS after being called with complaints of weakness and chills. She lives with her daughter. The family has been sick for the past week. The patient has been having flu-like symptoms for the past 2 days. She has not c/o chest pain at home or in the ED. She appears comfortable. EKG with inferior ST elevation c/w acute MI. She is a DNR and her daughter states that she does not wish to have invasive procedures performed. This was also documented in the office visit note from May 2023 at which time the patient and her daughter did not wish to have any stress testing or further invasive procedures.   She has no complaints at the time of my exam. She denies chest pain and dyspnea. BP is 120/62. Heart rate 90 bpm.    Past Medical History:  Diagnosis Date   Breast CA (HCC) 12/08/2015   S/p lumpectomy left breast   Coronary artery disease    s/p NSTEMI with PCI of RCA   Diabetes mellitus    Dyslipidemia, goal LDL below 70    Thyroid disease     Past Surgical History:  Procedure Laterality Date   BREAST LUMPECTOMY     CARDIAC CATHETERIZATION     CORONARY ANGIOPLASTY     PCI of RCA   stent implant  [id number]     Home Medications:  Prior to Admission medications   Medication Sig Start Date End Date Taking? Authorizing Provider  albuterol (VENTOLIN HFA) 108 (90 Base) MCG/ACT inhaler Inhale 2 puffs into the lungs every 4 (four) hours as needed. 01/13/22   [provider]  Cholecalciferol (VITAMIN D3 ADULT GUMMIES) 25 MCG (1000 UT)  CHEW as needed.    [provider]  clopidogrel (PLAVIX) 75 MG tablet Take 1 tablet (75 mg total) by mouth daily. 05/05/21   Quintella Reichert, MD  HYDROcodone-acetaminophen (NORCO/VICODIN) 5-325 MG tablet Take 1 tablet by mouth every 6 (six) hours as needed for severe pain or moderate pain. 01/29/23   Antony Madura, PA-C  levothyroxine (SYNTHROID) 75 MCG tablet Take 75 mcg by mouth daily with breakfast. 01/25/19   [provider]  rosuvastatin (CRESTOR) 5 MG tablet Take 1 tablet (5 mg total) by mouth daily. 05/05/21   Quintella Reichert, MD  sitaGLIPtin (JANUVIA) 50 MG tablet Take 50 mg by mouth daily.    [provider]    Inpatient Medications: Scheduled Meds:  aspirin  324 mg Oral Once   heparin  60 Units/kg Intravenous Once   Continuous Infusions:  sodium chloride     PRN Meds: nitroGLYCERIN  Allergies:   No Known Allergies  Social History:   Social History   Socioeconomic History   Marital status: Widowed    Spouse name: Not on file   Number of children: Not on file   Years of education: Not on file   Highest education level: Not on file  Occupational History   Not on file  Tobacco Use   Smoking status: Former  Smokeless tobacco: Never  Vaping Use   Vaping status: Never Used  Substance and Sexual Activity   Alcohol use: No   Drug use: No   Sexual activity: Never  Other Topics Concern   Not on file  Social History Narrative   Not on file   Social Drivers of Health   Financial Resource Strain: Not on file  Food Insecurity: Not on file  Transportation Needs: Not on file  Physical Activity: Not on file  Stress: Not on file  Social Connections: Not on file  Intimate Partner Violence: Not on file    Family History:    Family History  Problem Relation Age of Onset   Emphysema Mother    Prostate cancer Father    Breast cancer Sister    Arrhythmia Brother      ROS:  Please see the history of present illness.   All other ROS reviewed  and negative.     Physical Exam/Data:   Vitals:   10/08/23 1400 10/08/23 1403  BP: 138/75   Pulse: 84   Resp: 20   Temp: 97.7 F (36.5 C)   TempSrc: Oral   SpO2: 96%   Weight:  59 kg  Height:  5\' 3"  (1.6 m)   No intake or output data in the 24 hours ending 10/08/23 1413    10/08/2023    2:03 PM 01/28/2023   10:35 PM 03/03/2022    3:33 PM  Last 3 Weights  Weight (lbs) 130 lb 127 lb 13.9 oz 129 lb 9.6 oz  Weight (kg) 58.968 kg 58 kg 58.786 kg     Body mass index is 23.03 kg/m.  General:  thin, cachectic female in NAD HEENT: normal Neck: no JVD Vascular: No carotid bruits; Distal pulses 2+ bilaterally Cardiac:  normal S1, S2; RRR; no murmur  Lungs:  clear to auscultation bilaterally, no wheezing, rhonchi or rales  Abd: soft, nontender, no hepatomegaly  Ext: no edema Musculoskeletal:  No deformities, BUE and BLE strength normal and equal Skin: warm and dry  Neuro:  CNs 2-12 intact, no focal abnormalities noted Psych:  Normal affect   EKG:  The EKG was personally reviewed and demonstrates:  sinus with 3 mm inferior ST elevation Telemetry:  Telemetry was personally reviewed and demonstrates:  sinus  Relevant CV Studies:   Laboratory Data:  High Sensitivity Troponin:  No results for input(s): "TROPONINIHS" in the last 720 hours.   ChemistryNo results for input(s): "NA", "K", "CL", "CO2", "GLUCOSE", "BUN", "CREATININE", "CALCIUM", "MG", "GFRNONAA", "GFRAA", "ANIONGAP" in the last 168 hours.  No results for input(s): "PROT", "ALBUMIN", "AST", "ALT", "ALKPHOS", "BILITOT" in the last 168 hours. Lipids No results for input(s): "CHOL", "TRIG", "HDL", "LABVLDL", "LDLCALC", "CHOLHDL" in the last 168 hours.  HematologyNo results for input(s): "WBC", "RBC", "HGB", "HCT", "MCV", "MCH", "MCHC", "RDW", "PLT" in the last 168 hours. Thyroid No results for input(s): "TSH", "FREET4" in the last 168 hours.  BNPNo results for input(s): "BNP", "PROBNP" in the last 168 hours.  DDimer No  results for input(s): "DDIMER" in the last 168 hours.   Radiology/Studies:  No results found.  Assessment and Plan:   Acute inferior MI: She clearly has EKG changes suggestive of an acute inferior MI, likely from stent thrombosis of the RCA stent. She has no chest pain. Given advanced age, poor functional status and memory issues, she is not felt to be a candidate for cardiac catheterization. I have reviewed this with her daughter at the bedside. The patient is  comfortable and wishes to have a DNR status placed on the chart. She will be admitted to the Hospitalist service. Labs pending. She was unable to swallow the ASA that was given due to choking. I would recommend rectal ASA and IV heparin. She should be monitored on telemetry. Cardiology will follow along but the plan from our standpoint would be conservative management of MI with medications only. No plans for invasive testing. As discussed with her daughter, we will plan to keep her comfortable.   For questions or updates, please contact [facility] Please consult [email or web address] for contact info under   Signed, Verne Carrow, MD  10/08/2023 2:13 PM

## 2023-10-08 NOTE — ED Notes (Signed)
ED TO INPATIENT HANDOFF REPORT  ED Nurse Name and Phone #: Beatris Ship RN [phone]  S Name/Age/Gender Lacey Kemp 89 y.o. female Room/Bed: 005C/005C  Code Status   Code Status: Limited: Do not attempt resuscitation (DNR) -DNR-LIMITED -Do Not Intubate/DNI   Home/SNF/Other Home Patient oriented to: self Is this baseline? Yes   Triage Complete: Triage complete  Chief Complaint STEMI (ST elevation myocardial infarction) [facility]) [I21.3]  Triage Note Pt present to ED from home with c/o flu like symptoms. Per EMS, pt began to c/o chest pain upon entering EMS. EMS attempted to give 324mg  aspirin by mouth but pt began to choke on 162mg  aspirin. Pt oriented to name only at this time.    Allergies No Known Allergies  Level of Care/Admitting Diagnosis ED Disposition     ED Disposition  Admit   Condition  --   Comment  Hospital Area: [facility] [[id number]]  Level of Care: Progressive [102]  Admit to Progressive based on following criteria: CARDIOVASCULAR & THORACIC of moderate stability with acute coronary syndrome symptoms/low risk myocardial infarction/hypertensive urgency/arrhythmias/heart failure potentially compromising stability and stable post cardiovascular intervention patients.  May admit patient to [facility] or [facility] if equivalent level of care is available:: Yes  Covid Evaluation: Asymptomatic - no recent exposure (last 10 days) testing not required  Diagnosis: STEMI (ST elevation myocardial infarction) [facility]) [[id number]]  Admitting Physician: Alessandra Bevels [[id number]]  Attending Physician: Alessandra Bevels [[id number]]  Certification:: I certify this patient will need inpatient services for at least 2 midnights  Expected Medical Readiness: 10/10/2023          B Medical/Surgery History Past Medical History:  Diagnosis Date   Breast CA (HCC) 12/08/2015   S/p lumpectomy left breast   Coronary artery disease    s/p NSTEMI with PCI of RCA    Diabetes mellitus    Dyslipidemia, goal LDL below 70    Thyroid disease    Past Surgical History:  Procedure Laterality Date   BREAST LUMPECTOMY     CARDIAC CATHETERIZATION     CORONARY ANGIOPLASTY     PCI of RCA   stent implant  [id number]     A IV Location/Drains/Wounds Patient Lines/Drains/Airways Status     Active Line/Drains/Airways     Name Placement date Placement time Site Days   Peripheral IV 10/08/23 20 G 1" Anterior;Right Forearm 10/08/23  1409  Forearm  less than 1   Peripheral IV 10/08/23 20 G 1" Left Antecubital 10/08/23  1409  Antecubital  less than 1            Intake/Output Last 24 hours No intake or output data in the 24 hours ending 10/08/23 1535  Labs/Imaging Results for orders placed or performed during the hospital encounter of 10/08/23 (from the past 48 hours)  Hemoglobin A1c     Status: Abnormal   Collection Time: 10/08/23  1:50 PM  Result Value Ref Range   Hgb A1c MFr Bld 6.6 (H) 4.8 - 5.6 %    Comment: (NOTE) Pre diabetes:          5.7%-6.4%  Diabetes:              >6.4%  Glycemic control for   <7.0% adults with diabetes    Mean Plasma Glucose 142.72 mg/dL    Comment: Performed at [facility], [address], [place], [place] [address]  CBC with Differential/Platelet     Status: Abnormal   Collection Time: 10/08/23  1:50 PM  Result Value Ref Range   WBC 10.6 (H) 4.0 - 10.5 K/uL   RBC 4.94 3.87 - 5.11 MIL/uL   Hemoglobin 13.7 12.0 - 15.0 g/dL   HCT 40.9 81.1 - 91.4 %   MCV 85.0 80.0 - 100.0 fL   MCH 27.7 26.0 - 34.0 pg   MCHC 32.6 30.0 - 36.0 g/dL   RDW 78.2 95.6 - 21.3 %   Platelets 211 150 - 400 K/uL   nRBC 0.0 0.0 - 0.2 %   Neutrophils Relative % 76 %   Neutro Abs 8.0 (H) 1.7 - 7.7 K/uL   Lymphocytes Relative 17 %   Lymphs Abs 1.8 0.7 - 4.0 K/uL   Monocytes Relative 6 %   Monocytes Absolute 0.6 0.1 - 1.0 K/uL   Eosinophils Relative 0 %   Eosinophils Absolute 0.0 0.0 - 0.5 K/uL   Basophils Relative 0 %   Basophils  Absolute 0.0 0.0 - 0.1 K/uL   Immature Granulocytes 1 %   Abs Immature Granulocytes 0.05 0.00 - 0.07 K/uL    Comment: Performed at [facility], [address] [place], [place] [address]  Protime-INR     Status: None   Collection Time: 10/08/23  1:50 PM  Result Value Ref Range   Prothrombin Time 15.0 11.4 - 15.2 seconds   INR 1.2 0.8 - 1.2    Comment: (NOTE) INR goal varies based on device and disease states. Performed at [facility], [address] [place], [place] [address]   APTT     Status: None   Collection Time: 10/08/23  1:50 PM  Result Value Ref Range   aPTT 30 24 - 36 seconds    Comment: Performed at [facility], [address], [place], [place] [address]  I-Stat CG4 Lactic Acid, ED     Status: None   Collection Time: 10/08/23  2:09 PM  Result Value Ref Range   Lactic Acid, Venous 1.2 0.5 - 1.9 mmol/L   No results found.  Pending Labs Unresulted Labs (From admission, onward)     Start     Ordered   10/09/23 0500  Lipoprotein A (LPA)  Tomorrow morning,   R        10/08/23 1516   10/09/23 0500  Heparin level (unfractionated)  Daily,   R      10/08/23 1532   10/09/23 0500  CBC  Daily,   R      10/08/23 1532   10/08/23 2300  Heparin level (unfractionated)  Once-Timed,   TIMED        10/08/23 1532   10/08/23 1510  Urinalysis, w/ Reflex to Culture (Infection Suspected) -Urine, Clean Catch  Once,   URGENT       Question:  Specimen Source  Answer:  Urine, Clean Catch   10/08/23 1509   10/08/23 1403  Resp panel by RT-PCR (RSV, Flu A&B, Covid) Anterior Nasal Swab  Once,   URGENT        10/08/23 1402   10/08/23 1350  Comprehensive metabolic panel  (Stemi Panel (PNL))  ONCE - STAT,   STAT        10/08/23 1349   10/08/23 1350  Lipid panel  (Stemi Panel (PNL))  Once,   URGENT        10/08/23 1349            Vitals/Pain Today's Vitals   10/08/23 1416 10/08/23 1430 10/08/23 1445 10/08/23 1500  BP:  127/69 130/70 128/69  Pulse: 87 82 83 77   Resp: 19 (!) 32 (!) 29 (!) 26  Temp:      TempSrc:      SpO2: 95% 97% 95% 95%  Weight:      Height:      PainSc:        Isolation Precautions No active isolations  Medications Medications  0.9 %  sodium chloride infusion (20 mLs Intravenous New Bag/Given 10/08/23 1413)  nitroGLYCERIN (NITROSTAT) SL tablet 0.4 mg (has no administration in time range)  heparin ADULT infusion 100 units/mL (25000 units/252mL) (700 Units/hr Intravenous New Bag/Given 10/08/23 1422)  HYDROcodone-acetaminophen (NORCO/VICODIN) 5-325 MG per tablet 1 tablet (has no administration in time range)  rosuvastatin (CRESTOR) tablet 10 mg (has no administration in time range)  levothyroxine (SYNTHROID) tablet 75 mcg (has no administration in time range)  linagliptin (TRADJENTA) tablet 5 mg (has no administration in time range)  clopidogrel (PLAVIX) tablet 75 mg (has no administration in time range)  albuterol (PROVENTIL) (2.5 MG/3ML) 0.083% nebulizer solution 2.5 mg (has no administration in time range)  acetaminophen (TYLENOL) tablet 650 mg (has no administration in time range)  ondansetron (ZOFRAN) injection 4 mg (has no administration in time range)  metoprolol tartrate (LOPRESSOR) tablet 25 mg (has no administration in time range)  insulin aspart (novoLOG) injection 0-9 Units (has no administration in time range)  aspirin chewable tablet 324 mg (324 mg Oral Given 10/08/23 1414)  heparin bolus via infusion 3,550 Units (3,550 Units Intravenous Bolus from Bag 10/08/23 1424)    Mobility Has not been OOB since arrival to ED.     Focused Assessments Cardiac Assessment Handoff:  Cardiac Rhythm: Other (Comment) (STEMI ... sinus rythm with ST elevation) Lab Results  Component Value Date   CKTOTAL 57 04/21/2012   CKMB 2.7 04/21/2012   TROPONINI <0.30 04/21/2012   No results found for: "DDIMER" Does the Patient currently have chest pain? No    R Recommendations: See Admitting Provider Note  Report given  to: 6E06

## 2023-10-08 NOTE — ED Provider Notes (Signed)
[place] EMERGENCY DEPARTMENT AT [facility] Provider Note   CSN: [id number] Arrival date & time: 10/08/23  1344     History  Chief Complaint  Patient presents with   Code STEMI    Lacey Kemp is a 89 y.o. female.  HPI  89 year old female with medical history significant for CAD status post stent placement, diabetes mellitus presenting to the emergency department as a code STEMI.  EMS was called out to the patient's house due to flulike symptoms.  The patient had complained of some chest pain with EMS and EMS obtained a twelve-lead which showed an inferior STEMI with reciprocal changes.  Additional history was provided by the patient's daughter, Venia Minks who states that the patient had been complaining of flulike symptoms over the last day or 2 and a flulike illness have been going around the house.  On arrival, the patient denies any chest pain, inferior STEMI confirmed on EKG and cardiology was paged, Dr. Clifton Jahsiah Carpenter was bedside for evaluation.  Patient did not tolerate full strength aspirin with EMS.  Home Medications Prior to Admission medications   Medication Sig Start Date End Date Taking? Authorizing Provider  acetaminophen (TYLENOL) 500 MG tablet Take 1,000 mg by mouth 2 (two) times daily.   Yes [provider]  clopidogrel (PLAVIX) 75 MG tablet Take 1 tablet (75 mg total) by mouth daily. 05/05/21  Yes Quintella Reichert, MD  levothyroxine (SYNTHROID) 75 MCG tablet Take 75 mcg by mouth daily with breakfast. 01/25/19  Yes [provider]  rosuvastatin (CRESTOR) 5 MG tablet Take 1 tablet (5 mg total) by mouth daily. 05/05/21  Yes Turner, Traci R, MD  sitaGLIPtin (JANUVIA) 50 MG tablet Take 50 mg by mouth daily.   Yes [provider]  albuterol (VENTOLIN HFA) 108 (90 Base) MCG/ACT inhaler Inhale 2 puffs into the lungs every 4 (four) hours as needed. Needs a refill on an non-expired inhaler. Patient not taking: Reported on 10/08/2023 01/13/22    [provider]      Allergies    Patient has no known allergies.    Review of Systems   Review of Systems  All other systems reviewed and are negative.   Physical Exam Updated Vital Signs BP 132/71   Pulse 81   Temp 97.7 F (36.5 C) (Oral)   Resp (!) 26   Ht 5\' 3"  (1.6 m)   Wt 59 kg   SpO2 95%   BMI 23.03 kg/m  Physical Exam Vitals and nursing note reviewed.  Constitutional:      General: She is not in acute distress.    Appearance: She is well-developed.     Comments: GCS 14  HENT:     Head: Normocephalic and atraumatic.  Eyes:     Conjunctiva/sclera: Conjunctivae normal.  Cardiovascular:     Rate and Rhythm: Normal rate and regular rhythm.  Pulmonary:     Effort: Pulmonary effort is normal. No respiratory distress.     Breath sounds: Normal breath sounds.  Abdominal:     Palpations: Abdomen is soft.     Tenderness: There is no abdominal tenderness.  Musculoskeletal:        General: No swelling.     Cervical back: Neck supple.     Right lower leg: No edema.     Left lower leg: No edema.  Skin:    General: Skin is warm and dry.     Capillary Refill: Capillary refill takes less than 2 seconds.  Neurological:  Mental Status: She is alert.  Psychiatric:        Mood and Affect: Mood normal.     ED Results / Procedures / Treatments   Labs (all labs ordered are listed, but only abnormal results are displayed) Labs Reviewed  RESP PANEL BY RT-PCR (RSV, FLU A&B, COVID)  RVPGX2 - Abnormal; Notable for the following components:      Result Value   Influenza A by PCR POSITIVE (*)    All other components within normal limits  HEMOGLOBIN A1C - Abnormal; Notable for the following components:   Hgb A1c MFr Bld 6.6 (*)    All other components within normal limits  CBC WITH DIFFERENTIAL/PLATELET - Abnormal; Notable for the following components:   WBC 10.6 (*)    Neutro Abs 8.0 (*)    All other components within normal limits  COMPREHENSIVE METABOLIC  PANEL - Abnormal; Notable for the following components:   Sodium 134 (*)    CO2 20 (*)    Glucose, Bld 147 (*)    Albumin 3.3 (*)    All other components within normal limits  TROPONIN I (HIGH SENSITIVITY) - Abnormal; Notable for the following components:   Troponin I (High Sensitivity) 1,023 (*)    All other components within normal limits  PROTIME-INR  APTT  LIPID PANEL  URINALYSIS, W/ REFLEX TO CULTURE (INFECTION SUSPECTED)  HEPARIN LEVEL (UNFRACTIONATED)  I-STAT CG4 LACTIC ACID, ED  TROPONIN I (HIGH SENSITIVITY)    EKG EKG Interpretation Date/Time:  Saturday October 08 2023 13:55:11 EST Ventricular Rate:  84 PR Interval:  192 QRS Duration:  77 QT Interval:  358 QTC Calculation: 424 R Axis:   14  Text Interpretation: Sinus rhythm LVH with secondary repolarization abnormality Inferior infarct, acute (RCA) Lateral leads are also involved Probable RV involvement, suggest recording right precordial leads >>> Acute MI <<< STEMI confirmed with on-call cardiology Confirmed by Ernie Avena ([id number]) on 10/08/2023 2:02:56 PM  Radiology DG Chest Port 1 View Result Date: 10/08/2023 CLINICAL DATA:  STEMI EXAM: PORTABLE CHEST 1 VIEW COMPARISON:  01/28/2023, 01/13/2022 FINDINGS: Low lung volumes. Cardiomegaly with aortic atherosclerosis.scarring within the bilateral lungs with suspected fibrosis. Increased slightly nodular opacity in the right mid lung compared to prior. No pleural effusion or pneumothorax. IMPRESSION: Low lung volumes with scarring and suspected fibrosis. Increased slightly nodular opacity in the right mid lung compared to prior, recommend follow-up two-view chest x-ray versus CT for further assessment Electronically Signed   By: Jasmine Pang M.D.   On: 10/08/2023 15:43    Procedures .Critical Care  Performed by: Ernie Avena, MD Authorized by: Ernie Avena, MD   Critical care provider statement:    Critical care time (minutes):  30   Critical care was time spent  personally by me on the following activities:  Development of treatment plan with patient or surrogate, discussions with consultants, evaluation of patient's response to treatment, examination of patient, ordering and review of laboratory studies, ordering and review of radiographic studies, ordering and performing treatments and interventions, pulse oximetry, re-evaluation of patient's condition and review of old charts   Care discussed with: admitting provider       Medications Ordered in ED Medications  0.9 %  sodium chloride infusion (20 mLs Intravenous New Bag/Given 10/08/23 1413)  nitroGLYCERIN (NITROSTAT) SL tablet 0.4 mg (has no administration in time range)  heparin ADULT infusion 100 units/mL (25000 units/285mL) (700 Units/hr Intravenous New Bag/Given 10/08/23 1422)  HYDROcodone-acetaminophen (NORCO/VICODIN) 5-325 MG per tablet 1 tablet (has  no administration in time range)  rosuvastatin (CRESTOR) tablet 10 mg (has no administration in time range)  levothyroxine (SYNTHROID) tablet 75 mcg (has no administration in time range)  linagliptin (TRADJENTA) tablet 5 mg (has no administration in time range)  clopidogrel (PLAVIX) tablet 75 mg (has no administration in time range)  albuterol (PROVENTIL) (2.5 MG/3ML) 0.083% nebulizer solution 2.5 mg (has no administration in time range)  acetaminophen (TYLENOL) tablet 650 mg (has no administration in time range)  ondansetron (ZOFRAN) injection 4 mg (has no administration in time range)  metoprolol tartrate (LOPRESSOR) tablet 25 mg (has no administration in time range)  insulin aspart (novoLOG) injection 0-9 Units (has no administration in time range)  aspirin chewable tablet 324 mg (324 mg Oral Given 10/08/23 1414)  heparin bolus via infusion 3,550 Units (3,550 Units Intravenous Bolus from Bag 10/08/23 1424)    ED Course/ Medical Decision Making/ A&P                                 Medical Decision Making Amount and/or Complexity of Data  Reviewed Labs: ordered. Radiology: ordered.  Risk OTC drugs. Prescription drug management. Decision regarding hospitalization.    89 year old female with medical history significant for CAD status post stent placement, diabetes mellitus presenting to the emergency department as a code STEMI.  EMS was called out to the patient's house due to flulike symptoms.  The patient had complained of some chest pain with EMS and EMS obtained a twelve-lead which showed an inferior STEMI with reciprocal changes.  Additional history was provided by the patient's daughter, Venia Minks who states that the patient had been complaining of flulike symptoms over the last day or 2 and a flulike illness have been going around the house.  On arrival, the patient denies any chest pain, inferior STEMI confirmed on EKG and cardiology was paged, Dr. Clifton Demetri Kerman was bedside for evaluation.  Patient did not tolerate full strength aspirin with EMS.  On arrival, the patient was afebrile, not tachycardic or tachypneic, BP 138/75, saturating at 96% on room air.  Sinus rhythm noted on cardiac telemetry.  Patient was overall well-appearing on exam and not complaining of chest pain however inferior STEMI with reciprocal changes was noted on EKG. New compared to prior EKGs.  Per discussions with family members bedside, they would not want any invasive procedures done, discussions had with cardiology bedside, full consult note to follow.  Per discussions with the patient's daughter at bedside, the patient's CODE STATUS was changed to DNR.  Will provide measures for medical management and to keep the patient comfortable.  The patient was administered heparin and aspirin.  Plan for admission to medicine, hospitalist service consulted.  Labs: Initial troponin 1023, COVID PCR testing negative, influenza A PCR testing positive, CMP without significant electrolyte abnormality, CBC without anemia, mild nonspecific leukocytosis to 10.6 present,  lactic acid normal.  Hospitalist medicine was consulted for admission, Dr. Lajuana Ripple accepting.  The patient was admitted in stable condition following aspirin and heparin and on a heparin GTT.   Final Clinical Impression(s) / ED Diagnoses Final diagnoses:  ST elevation myocardial infarction involving right coronary artery [facility])    Rx / DC Orders ED Discharge Orders     None         Ernie Avena, MD 10/08/23 1614

## 2023-10-08 NOTE — H&P (Signed)
History and Physical    DOA: 10/08/2023  PCP: Delma Officer, PA  Patient coming from: Home  Chief Complaint: Weak and lethargic  HPI: Lacey Kemp is a 89 y.o. female with history h/o hypothyroidism, hyperlipidemia, DM, breast CA s/p lumpectomy, CAD status post PCI of RCA and on Plavix at baseline brought in by daughter as patient appeared to be weak and lethargic this morning.  Daughter apparently has had flulike symptoms all week and patient, who lives with her, yesterday seemed somewhat tired before going to bed.  This morning when daughter went to wake up the patient around 10 AM patient appeared somnolent-so she let her rest for a little bit and around noon when she woke her up again, patient apparently seemed lethargic and confused which prompted ED visit.  On arrival patient awake and denied any complaints in particular but noted to have ST elevations on EKG.  Hemodynamically stable and patient denied any chest pain or shortness of breath or palpitations.  She does report mild wet cough since yesterday.  Cardiology was summoned and upon discussion with daughter, decision was made to pursue conservative care as patient/family do not want any aggressive interventions.  Patient is also a DNR.  Viral panel was sent and troponins ordered.  Upon cardiology recommendation, hospitalist service was requested to admit this patient for medical management of STEMI and supportive care.  Did get some IV fluids in the ED and daughter feels patient really became more awake and alert after that.  Patient also received IV heparin bolus and initiated on heparin drip per ACS protocol.   Review of Systems: As per HPI, otherwise review of systems negative.    Past Medical History:  Diagnosis Date   Breast CA (HCC) 12/08/2015   S/p lumpectomy left breast   Coronary artery disease    s/p NSTEMI with PCI of RCA   Diabetes mellitus    Dyslipidemia, goal LDL below 70    Thyroid disease     Past  Surgical History:  Procedure Laterality Date   BREAST LUMPECTOMY     CARDIAC CATHETERIZATION     CORONARY ANGIOPLASTY     PCI of RCA   stent implant  [id number]    Social history:  reports that she has quit smoking. She has never used smokeless tobacco. She reports that she does not drink alcohol and does not use drugs.   No Known Allergies  Family History  Problem Relation Age of Onset   Emphysema Mother    Prostate cancer Father    Breast cancer Sister    Arrhythmia Brother       Prior to Admission medications   Medication Sig Start Date End Date Taking? Authorizing Provider  acetaminophen (TYLENOL) 500 MG tablet Take 1,000 mg by mouth 2 (two) times daily.   Yes [provider]  clopidogrel (PLAVIX) 75 MG tablet Take 1 tablet (75 mg total) by mouth daily. 05/05/21  Yes Quintella Reichert, MD  levothyroxine (SYNTHROID) 75 MCG tablet Take 75 mcg by mouth daily with breakfast. 01/25/19  Yes [provider]  rosuvastatin (CRESTOR) 5 MG tablet Take 1 tablet (5 mg total) by mouth daily. 05/05/21  Yes Turner, Traci R, MD  sitaGLIPtin (JANUVIA) 50 MG tablet Take 50 mg by mouth daily.   Yes [provider]  albuterol (VENTOLIN HFA) 108 (90 Base) MCG/ACT inhaler Inhale 2 puffs into the lungs every 4 (four) hours as needed. Needs a refill on an non-expired inhaler. Patient not taking:  Reported on 10/08/2023 01/13/22   [provider]    Physical Exam: Vitals:   10/08/23 1515 10/08/23 1530 10/08/23 1545 10/08/23 1625  BP: 125/67 (!) 126/90 132/71 109/64  Pulse: 81 78 81 74  Resp: (!) 26 (!) 26 (!) 26 16  Temp:    98.4 F (36.9 C)  TempSrc:    Axillary  SpO2: 95% 95% 95% 99%  Weight:    49.9 kg  Height:    5\' 4"  (1.626 m)    Constitutional: NAD, calm, comfortable Eyes: PERRL, lids and conjunctivae normal ENMT: Mucous membranes are moist. Posterior pharynx clear of any exudate or lesions.Normal dentition.  Neck: normal, supple, no masses, no  thyromegaly Respiratory: clear to auscultation bilaterally, no wheezing, no crackles. Normal respiratory effort. No accessory muscle use.  Cardiovascular: Regular rate and rhythm, no murmurs / rubs / gallops. No extremity edema. 2+ pedal pulses. No carotid bruits.  Abdomen: no tenderness, no masses palpated. No hepatosplenomegaly. Bowel sounds positive.  Musculoskeletal: no clubbing / cyanosis. No joint deformity upper and lower extremities. Good ROM, no contractures. Normal muscle tone.  Neurologic: CN 2-12 grossly intact. Sensation intact, DTR normal. Strength 5/5 in all 4.  Psychiatric: Normal judgment and insight. Alert and oriented x 3. Normal mood.  SKIN/catheters: no rashes, lesions, ulcers. No induration  Labs on Admission: I have personally reviewed following labs and imaging studies  CBC: Recent Labs  Lab 10/08/23 1350  WBC 10.6*  NEUTROABS 8.0*  HGB 13.7  HCT 42.0  MCV 85.0  PLT 211   Basic Metabolic Panel: Recent Labs  Lab 10/08/23 1350  NA 134*  K 4.2  CL 100  CO2 20*  GLUCOSE 147*  BUN 13  CREATININE 0.83  CALCIUM 9.0   GFR: Estimated Creatinine Clearance: 35.5 mL/min (by C-G formula based on SCr of 0.83 mg/dL). Recent Labs  Lab 10/08/23 1350 10/08/23 1409  WBC 10.6*  --   LATICACIDVEN  --  1.2   Liver Function Tests: Recent Labs  Lab 10/08/23 1350  AST 28  ALT 9  ALKPHOS 68  BILITOT 0.7  PROT 6.7  ALBUMIN 3.3*   No results for input(s): "LIPASE", "AMYLASE" in the last 168 hours. No results for input(s): "AMMONIA" in the last 168 hours. Coagulation Profile: Recent Labs  Lab 10/08/23 1350  INR 1.2   Cardiac Enzymes: No results for input(s): "CKTOTAL", "CKMB", "CKMBINDEX", "TROPONINI" in the last 168 hours. BNP (last 3 results) No results for input(s): "PROBNP" in the last 8760 hours. HbA1C: Recent Labs    10/08/23 1350  HGBA1C 6.6*   CBG: No results for input(s): "GLUCAP" in the last 168 hours. Lipid Profile: Recent Labs     10/08/23 1350  CHOL 119  HDL 52  LDLCALC 52  TRIG 74  CHOLHDL 2.3   Thyroid Function Tests: No results for input(s): "TSH", "T4TOTAL", "FREET4", "T3FREE", "THYROIDAB" in the last 72 hours. Anemia Panel: No results for input(s): "VITAMINB12", "FOLATE", "FERRITIN", "TIBC", "IRON", "RETICCTPCT" in the last 72 hours. Urine analysis: No results found for: "COLORURINE", "APPEARANCEUR", "LABSPEC", "PHURINE", "GLUCOSEU", "HGBUR", "BILIRUBINUR", "KETONESUR", "PROTEINUR", "UROBILINOGEN", "NITRITE", "LEUKOCYTESUR"  Radiological Exams on Admission: Personally reviewed  DG Chest Port 1 View Result Date: 10/08/2023 CLINICAL DATA:  STEMI EXAM: PORTABLE CHEST 1 VIEW COMPARISON:  01/28/2023, 01/13/2022 FINDINGS: Low lung volumes. Cardiomegaly with aortic atherosclerosis.scarring within the bilateral lungs with suspected fibrosis. Increased slightly nodular opacity in the right mid lung compared to prior. No pleural effusion or pneumothorax. IMPRESSION: Low lung volumes with scarring  and suspected fibrosis. Increased slightly nodular opacity in the right mid lung compared to prior, recommend follow-up two-view chest x-ray versus CT for further assessment Electronically Signed   By: Jasmine Pang M.D.   On: 10/08/2023 15:43    EKG: Independently reviewed.  Sinus rhythm with ST elevation in leads II, III,  aVF and ST depression in aVL.  QTc 426 ms     Assessment and Plan:   Principal Problem:   STEMI (ST elevation myocardial infarction) (HCC)    1.ST elevation MI: Patient does have a history of RCA stent and current EKG suggestive of inferior wall MI.  Per cardiology, could represent stent thrombosis.  Conservative management for now with aspirin, Plavix, IV heparin, statins and beta-blockers.  Patient apparently could not swallow aspirin when given initially on arrival to the ED ?due to choking.  Patient appears more awake now, will try p.o. meds if tolerates.  She is edentulous and daughter requested  soft diet.  If cannot swallow pills, will change aspirin to rectal and other meds to IV.  Confirmed again with patient and daughter regarding CODE STATUS and wishes for any interventions.  Both patient and daughter are comfortable with DNR status and per daughter, if patient were to have arrhythmia or worsening cardiac condition she would consider comfort care.  Her troponin did result with significant elevation-1,023. Appreciate cardiology evaluation will continue to follow the recommendations.  2.  Cough, lethargy: Related to problem #1 versus acute viral illness versus dehydration.  Patient did test positive for flu but she appears quite comfortable currently no signs of congestion or respiratory distress.  Consider Tamiflu if symptomatology worsens.  Her mental status is much improved now and she appears cheerful since IV hydration.  3.  Hypothyroidism: Synthroid  4.  Diabetes mellitus: Hold Januvia, sliding scale insulin for now.  5.  Hyperlipidemia: Resume Crestor at a higher dose in the setting of problem #1.  Lipid profile in AM  DVT prophylaxis: On heparin drip  Code Status: DNR as discussed above.Health care proxy would be daughter Venia Minks  Patient/Family Communication: Discussed with patient and all questions answered to satisfaction.  Consults called: Cardiology Admission status :I certify that at the point of admission it is my clinical judgment that the patient will require inpatient hospital care spanning beyond 2 midnights from the point of admission due to high intensity of service and high frequency of surveillance required.Inpatient status is judged to be reasonable and necessary in order to provide the required intensity of service to ensure the patient's safety. The patient's presenting symptoms, physical exam findings, and initial radiographic and laboratory data in the context of their chronic comorbidities is felt to place them at high risk for further clinical  deterioration. The following factors support the patient status of inpatient : STEMI requiring medical management.     Alessandra Bevels MD [facility] Pager in [place]  If 7PM-7AM, please contact night-coverage [email or web address]   10/08/2023, 4:56 PM

## 2023-10-09 DIAGNOSIS — I213 ST elevation (STEMI) myocardial infarction of unspecified site: Secondary | ICD-10-CM | POA: Diagnosis not present

## 2023-10-09 DIAGNOSIS — I2111 ST elevation (STEMI) myocardial infarction involving right coronary artery: Secondary | ICD-10-CM | POA: Diagnosis not present

## 2023-10-09 LAB — GLUCOSE, CAPILLARY
Glucose-Capillary: 113 mg/dL — ABNORMAL HIGH (ref 70–99)
Glucose-Capillary: 133 mg/dL — ABNORMAL HIGH (ref 70–99)
Glucose-Capillary: 249 mg/dL — ABNORMAL HIGH (ref 70–99)
Glucose-Capillary: 177 mg/dL — ABNORMAL HIGH (ref 70–99)

## 2023-10-09 LAB — CBC
HCT: 39.6 % (ref 36.0–46.0)
Hemoglobin: 12.8 g/dL (ref 12.0–15.0)
MCH: 27.2 pg (ref 26.0–34.0)
MCHC: 32.3 g/dL (ref 30.0–36.0)
MCV: 84.3 fL (ref 80.0–100.0)
Platelets: 190 10*3/uL (ref 150–400)
RBC: 4.7 MIL/uL (ref 3.87–5.11)
RDW: 14.4 % (ref 11.5–15.5)
WBC: 11.2 10*3/uL — ABNORMAL HIGH (ref 4.0–10.5)
nRBC: 0 % (ref 0.0–0.2)

## 2023-10-09 LAB — HEPARIN LEVEL (UNFRACTIONATED)
Heparin Unfractionated: 0.7 [IU]/mL (ref 0.30–0.70)
Heparin Unfractionated: 0.44 [IU]/mL (ref 0.30–0.70)
Heparin Unfractionated: 0.41 [IU]/mL (ref 0.30–0.70)

## 2023-10-09 MED ORDER — OSELTAMIVIR PHOSPHATE 30 MG PO CAPS
30.0000 mg | ORAL_CAPSULE | Freq: Two times a day (BID) | ORAL | Status: DC
  Filled 2023-10-09 (×3): qty 1

## 2023-10-09 MED ORDER — OSELTAMIVIR PHOSPHATE 75 MG PO CAPS
75.0000 mg | ORAL_CAPSULE | Freq: Once | ORAL | Status: AC
  Administered 2023-10-09: 75 mg via ORAL
  Filled 2023-10-09: qty 1

## 2023-10-09 NOTE — Progress Notes (Signed)
   Rounding Note    Patient Name: Lacey Kemp Date of Encounter: 10/09/2023  [facility] Cardiologist: Armanda Magic, MD   Subjective   NAEO. Family at bedside. Spoke with son on phone who is physician with updates.  Vital Signs    Vitals:   10/08/23 2040 10/08/23 2331 10/09/23 0605 10/09/23 0741  BP: (!) 96/52 (!) 108/51 (!) 107/52 (!) 104/54  Pulse: (!) 57 (!) 54 (!) 56   Resp: 16 20 16    Temp: 97.6 F (36.4 C) 97.6 F (36.4 C) (!) 97.5 F (36.4 C) 97.7 F (36.5 C)  TempSrc: Oral Oral Oral Oral  SpO2: 98% 99% 100%   Weight:      Height:        Intake/Output Summary (Last 24 hours) at 10/09/2023 0949 Last data filed at 10/09/2023 0800 Gross per 24 hour  Intake 217.62 ml  Output 100 ml  Net 117.62 ml      10/08/2023    4:25 PM 10/08/2023    2:03 PM 01/28/2023   10:35 PM  Last 3 Weights  Weight (lbs) 110 lb 0.2 oz 130 lb 127 lb 13.9 oz  Weight (kg) 49.9 kg 58.968 kg 58 kg      Telemetry    Sinus with PVCs - Personally Reviewed  ECG    Personally Reviewed  Physical Exam   GEN: No acute distress.  Elderly. Frail. Cardiac: RRR, no murmurs, rubs, or gallops.  Respiratory: Clear to auscultation bilaterally. Psych: Normal affect   Assessment & Plan    #STEMI #CAD Hemodynamically stable. Electrically stable. Suspected inferior wall MI. Medical management pursued by interventional team. Cont aspirin Cont plavix Cont heparin Cont heparin gtt Cont statin  #Cough #Flu + IS ordered  #DNR     Sheria Lang T. Lalla Brothers, MD, [facility], [facility] Cardiac Electrophysiology

## 2023-10-09 NOTE — Progress Notes (Signed)
PHARMACY - ANTICOAGULATION CONSULT NOTE  Pharmacy Consult for heparin infusion Indication: chest pain/ACS  No Known Allergies  Patient Measurements: Height: 5\' 4"  (162.6 cm) Weight: 49.9 kg (110 lb 0.2 oz) IBW/kg (Calculated) : 54.7 Heparin Dosing Weight: 59 kg  Vital Signs: Temp: 97.6 F (36.4 C) (12/14 2331) Temp Source: Oral (12/14 2331) BP: 108/51 (12/14 2331) Pulse Rate: 54 (12/14 2331)  Labs: Recent Labs    10/08/23 1350 10/08/23 1559 10/08/23 2329  HGB 13.7  --   --   HCT 42.0  --   --   PLT 211  --   --   APTT 30  --   --   LABPROT 15.0  --   --   INR 1.2  --   --   HEPARINUNFRC  --   --  0.70  CREATININE 0.83  --   --   TROPONINIHS 1,023* 20,223*  --     Estimated Creatinine Clearance: 35.5 mL/min (by C-G formula based on SCr of 0.83 mg/dL).  Assessment: 89 yo F presents to ED with c/o flu like symptoms and chest pain. ECG consistent with acute inferior STEMI. Decision was made to not undergo intervention and pursue medical management only. Pt does not take any anticoagulation at home. Pharmacy consulted to dose heparin infusion per ACS protocol.   12/15 AM: heparin level returned at 0.7 on 700 units/hr (upper end of goal). No issues with the heparin infusion running continuously or signs/symptoms of bleeding reported. Last CBC stable  Goal of Therapy:  Heparin level 0.3-0.7 units/ml Monitor platelets by anticoagulation protocol: Yes   Plan:  Decrease heparin infusion 650 units/hr to stay within goal range Check confirmatory heparin level in 8 hours Monitor daily CBC, heparin level, and for s/sx of bleeding F/u cardiology recs for duration of heparin therapy  Arabella Merles, PharmD. Clinical Pharmacist 10/09/2023 12:14 AM

## 2023-10-09 NOTE — Progress Notes (Signed)
PHARMACY - ANTICOAGULATION CONSULT NOTE  Pharmacy Consult for heparin infusion Indication: chest pain/ACS  No Known Allergies  Patient Measurements: Height: 5\' 4"  (162.6 cm) Weight: 49.9 kg (110 lb 0.2 oz) IBW/kg (Calculated) : 54.7 Heparin Dosing Weight: 59 kg  Vital Signs: Temp: 97.7 F (36.5 C) (12/15 0741) Temp Source: Oral (12/15 0741) BP: 104/54 (12/15 0741) Pulse Rate: 56 (12/15 0605)  Labs: Recent Labs    10/08/23 1350 10/08/23 1559 10/08/23 2329 10/09/23 0456 10/09/23 1027  HGB 13.7  --   --   --  12.8  HCT 42.0  --   --   --  39.6  PLT 211  --   --   --  190  APTT 30  --   --   --   --   LABPROT 15.0  --   --   --   --   INR 1.2  --   --   --   --   HEPARINUNFRC  --   --  0.70 0.44 0.41  CREATININE 0.83  --   --   --   --   TROPONINIHS 1,023* 20,223*  --   --   --     Estimated Creatinine Clearance: 35.5 mL/min (by C-G formula based on SCr of 0.83 mg/dL).  Assessment: 89 yo F presents to ED with c/o flu like symptoms and chest pain. ECG consistent with acute inferior STEMI. Decision was made to not undergo intervention and pursue medical management only. Pt does not take any anticoagulation at home. Pharmacy consulted to dose heparin infusion per ACS protocol.   Heparin level remains therapeutic at 0.41 on 650 units/hr. Heparin level was drawn late due to inability to get lab the first attempt. No issues with heparin infusion and no new bleeding noted per RN. Hgb and PLT stable. Noted no invasive procedures at this time.   Goal of Therapy:  Heparin level 0.3-0.7 units/ml Monitor platelets by anticoagulation protocol: Yes   Plan:  Continue heparin infusion at 650 units/hr to stay within goal range Monitor daily CBC, heparin level, and for s/sx of bleeding F/u cardiology recs for duration of heparin therapy  Lennie Muckle, PharmD PGY1 Pharmacy Resident 10/09/2023 11:43 AM

## 2023-10-09 NOTE — Plan of Care (Signed)
  Problem: Education: Goal: Ability to describe self-care measures that may prevent or decrease complications (Diabetes Survival Skills Education) will improve Outcome: Progressing Goal: Individualized Educational Video(s) Outcome: Progressing   Problem: Coping: Goal: Ability to adjust to condition or change in health will improve Outcome: Progressing   Problem: Fluid Volume: Goal: Ability to maintain a balanced intake and output will improve Outcome: Progressing   Problem: Health Behavior/Discharge Planning: Goal: Ability to identify and utilize available resources and services will improve Outcome: Progressing Goal: Ability to manage health-related needs will improve Outcome: Progressing   Problem: Metabolic: Goal: Ability to maintain appropriate glucose levels will improve Outcome: Progressing   Problem: Nutritional: Goal: Maintenance of adequate nutrition will improve Outcome: Progressing Goal: Progress toward achieving an optimal weight will improve Outcome: Progressing   Problem: Skin Integrity: Goal: Risk for impaired skin integrity will decrease Outcome: Progressing   Problem: Tissue Perfusion: Goal: Adequacy of tissue perfusion will improve Outcome: Progressing   Problem: Education: Goal: Understanding of cardiac disease, CV risk reduction, and recovery process will improve Outcome: Progressing Goal: Individualized Educational Video(s) Outcome: Progressing   Problem: Activity: Goal: Ability to tolerate increased activity will improve Outcome: Progressing   Problem: Cardiac: Goal: Ability to achieve and maintain adequate cardiovascular perfusion will improve Outcome: Progressing   Problem: Health Behavior/Discharge Planning: Goal: Ability to safely manage health-related needs after discharge will improve Outcome: Progressing   Problem: Education: Goal: Knowledge of General Education information will improve Description: Including pain rating scale,  medication(s)/side effects and non-pharmacologic comfort measures Outcome: Progressing   Problem: Health Behavior/Discharge Planning: Goal: Ability to manage health-related needs will improve Outcome: Progressing   Problem: Clinical Measurements: Goal: Ability to maintain clinical measurements within normal limits will improve Outcome: Progressing Goal: Will remain free from infection Outcome: Progressing Goal: Diagnostic test results will improve Outcome: Progressing Goal: Respiratory complications will improve Outcome: Progressing Goal: Cardiovascular complication will be avoided Outcome: Progressing   Problem: Activity: Goal: Risk for activity intolerance will decrease Outcome: Progressing   Problem: Nutrition: Goal: Adequate nutrition will be maintained Outcome: Progressing   Problem: Coping: Goal: Level of anxiety will decrease Outcome: Progressing   Problem: Elimination: Goal: Will not experience complications related to bowel motility Outcome: Progressing Goal: Will not experience complications related to urinary retention Outcome: Progressing   Problem: Pain Management: Goal: General experience of comfort will improve Outcome: Progressing   Problem: Safety: Goal: Ability to remain free from injury will improve Outcome: Progressing   Problem: Skin Integrity: Goal: Risk for impaired skin integrity will decrease Outcome: Progressing

## 2023-10-09 NOTE — Progress Notes (Signed)
PROGRESS NOTE    Lacey Kemp  WGN:[id number] DOB: 08/30/1931 DOA: 10/08/2023 PCP: Delma Officer, PA  Outpatient Specialists:     Brief Narrative:  Patient is a 89 year old female with past medical history significant for coronary artery disease s/p PCI to RCA, hyperlipidemia, diabetes mellitus, hypothyroidism and breast cancer status postlumpectomy.  Patient has been exposed to immediate family with flulike illness.  Patient presented with weakness, , chest pain and elevated troponin (1023-20,223).  EKG on presentation revealed STEMI (inferior wall).  Patient was seen by the cardiology team and conservative management has been elected.  Patient is influenza A positive.  Patient is DO NOT RESUSCITATE.  10/09/2023: Patient seen alongside patient's daughter and son-in-law.  Not in any distress.   Assessment & Plan:   Principal Problem:   STEMI (ST elevation myocardial infarction) (HCC)   ST elevation MI:  -See above documentation. -Patient is currently on Plavix, heparin drip, sublingual nitro metoprolol and Crestor. -No chest pain reported today. -Conservative management of STEMI. -Cardiology input is appreciated.  Influenza A:  -Patient has sick contacts. -Start Tamiflu. -Supportive care.     Hypothyroidism: -Synthroid   Diabetes mellitus:  -Hold Januvia, sliding scale insulin for now.   Hyperlipidemia:  -Resume Crestor at a higher dose in the setting of problem #1.  Lipid profile in AM     DVT prophylaxis: Heparin drip. Code Status: DO NOT RESUSCITATE. Family Communication: Daughter and son-in-law Disposition Plan: This will depend on hospital course.   Consultants:  Cardiology. Low threshold to consult the palliative care team.  Procedures:  None for now.  Antimicrobials:  Tamiflu.   Subjective: No chest pain. No shortness of breath.  Objective: Vitals:   10/08/23 2040 10/08/23 2331 10/09/23 0605 10/09/23 0741  BP: (!) 96/52 (!) 108/51  (!) 107/52 (!) 104/54  Pulse: (!) 57 (!) 54 (!) 56   Resp: 16 20 16    Temp: 97.6 F (36.4 C) 97.6 F (36.4 C) (!) 97.5 F (36.4 C) 97.7 F (36.5 C)  TempSrc: Oral Oral Oral Oral  SpO2: 98% 99% 100%   Weight:      Height:        Intake/Output Summary (Last 24 hours) at 10/09/2023 1049 Last data filed at 10/09/2023 0800 Gross per 24 hour  Intake 217.62 ml  Output 100 ml  Net 117.62 ml   Filed Weights   10/08/23 1403 10/08/23 1625  Weight: 59 kg 49.9 kg    Examination:  General exam: Appears calm and comfortable. HEENT: Patient is pale.  No jaundice. Respiratory system: Clear to auscultation.  Cardiovascular system: S1 & S2 heard Gastrointestinal system: Abdomen is soft and nontender.   Central nervous system: Awake and alert.  Patient moves all extremities.  Alert and oriented. No focal neurological deficits. Extremities: No leg edema.  Data Reviewed: I have personally reviewed following labs and imaging studies  CBC: Recent Labs  Lab 10/08/23 1350 10/09/23 1027  WBC 10.6* 11.2*  NEUTROABS 8.0*  --   HGB 13.7 12.8  HCT 42.0 39.6  MCV 85.0 84.3  PLT 211 190   Basic Metabolic Panel: Recent Labs  Lab 10/08/23 1350  NA 134*  K 4.2  CL 100  CO2 20*  GLUCOSE 147*  BUN 13  CREATININE 0.83  CALCIUM 9.0   GFR: Estimated Creatinine Clearance: 35.5 mL/min (by C-G formula based on SCr of 0.83 mg/dL). Liver Function Tests: Recent Labs  Lab 10/08/23 1350  AST 28  ALT 9  ALKPHOS 68  BILITOT  0.7  PROT 6.7  ALBUMIN 3.3*   No results for input(s): "LIPASE", "AMYLASE" in the last 168 hours. No results for input(s): "AMMONIA" in the last 168 hours. Coagulation Profile: Recent Labs  Lab 10/08/23 1350  INR 1.2   Cardiac Enzymes: No results for input(s): "CKTOTAL", "CKMB", "CKMBINDEX", "TROPONINI" in the last 168 hours. BNP (last 3 results) No results for input(s): "PROBNP" in the last 8760 hours. HbA1C: Recent Labs    10/08/23 1350  HGBA1C 6.6*    CBG: Recent Labs  Lab 10/08/23 1700 10/08/23 2048 10/09/23 0744  GLUCAP 148* 128* 113*   Lipid Profile: Recent Labs    10/08/23 1350  CHOL 119  HDL 52  LDLCALC 52  TRIG 74  CHOLHDL 2.3   Thyroid Function Tests: No results for input(s): "TSH", "T4TOTAL", "FREET4", "T3FREE", "THYROIDAB" in the last 72 hours. Anemia Panel: No results for input(s): "VITAMINB12", "FOLATE", "FERRITIN", "TIBC", "IRON", "RETICCTPCT" in the last 72 hours. Urine analysis: No results found for: "COLORURINE", "APPEARANCEUR", "LABSPEC", "PHURINE", "GLUCOSEU", "HGBUR", "BILIRUBINUR", "KETONESUR", "PROTEINUR", "UROBILINOGEN", "NITRITE", "LEUKOCYTESUR" Sepsis Labs: @LABRCNTIP (procalcitonin:4,lacticidven:4)  ) Recent Results (from the past 240 hours)  Resp panel by RT-PCR (RSV, Flu A&B, Covid) Anterior Nasal Swab     Status: Abnormal   Collection Time: 10/08/23  2:03 PM   Specimen: Anterior Nasal Swab  Result Value Ref Range Status   SARS Coronavirus 2 by RT PCR NEGATIVE NEGATIVE Final   Influenza A by PCR POSITIVE (A) NEGATIVE Final   Influenza B by PCR NEGATIVE NEGATIVE Final    Comment: (NOTE) The Xpert Xpress SARS-CoV-2/FLU/RSV plus assay is intended as an aid in the diagnosis of influenza from Nasopharyngeal swab specimens and should not be used as a sole basis for treatment. Nasal washings and aspirates are unacceptable for Xpert Xpress SARS-CoV-2/FLU/RSV testing.  Fact Sheet for Patients: BloggerCourse.com  Fact Sheet for Healthcare Providers: SeriousBroker.it  This test is not yet approved or cleared by the [place] FDA and has been authorized for detection and/or diagnosis of SARS-CoV-2 by FDA under an Emergency Use Authorization (EUA). This EUA will remain in effect (meaning this test can be used) for the duration of the COVID-19 declaration under Section 564(b)(1) of the Act, 21 U.S.C. section 360bbb-3(b)(1), unless the  authorization is terminated or revoked.     Resp Syncytial Virus by PCR NEGATIVE NEGATIVE Final    Comment: (NOTE) Fact Sheet for Patients: BloggerCourse.com  Fact Sheet for Healthcare Providers: SeriousBroker.it  This test is not yet approved or cleared by the [place] FDA and has been authorized for detection and/or diagnosis of SARS-CoV-2 by FDA under an Emergency Use Authorization (EUA). This EUA will remain in effect (meaning this test can be used) for the duration of the COVID-19 declaration under Section 564(b)(1) of the Act, 21 U.S.C. section 360bbb-3(b)(1), unless the authorization is terminated or revoked.  Performed at [facility], [address] [place], [place] [address]          Radiology Studies: DG Chest Port 1 View Result Date: 10/08/2023 CLINICAL DATA:  STEMI EXAM: PORTABLE CHEST 1 VIEW COMPARISON:  01/28/2023, 01/13/2022 FINDINGS: Low lung volumes. Cardiomegaly with aortic atherosclerosis.scarring within the bilateral lungs with suspected fibrosis. Increased slightly nodular opacity in the right mid lung compared to prior. No pleural effusion or pneumothorax. IMPRESSION: Low lung volumes with scarring and suspected fibrosis. Increased slightly nodular opacity in the right mid lung compared to prior, recommend follow-up two-view chest x-ray versus CT for further assessment Electronically Signed  By: Jasmine Pang M.D.   On: 10/08/2023 15:43        Scheduled Meds:  clopidogrel  75 mg Oral Daily   insulin aspart  0-9 Units Subcutaneous TID WC   levothyroxine  75 mcg Oral Q0600   metoprolol tartrate  25 mg Oral BID   rosuvastatin  10 mg Oral Daily   Continuous Infusions:  sodium chloride 10 mL/hr at 10/08/23 1655   heparin 650 Units/hr (10/09/23 0024)     LOS: 1 day    Time spent: 35 minutes.    Berton Mount, MD  [facility] Pager #: [phone] 7PM-7AM contact night  coverage as above

## 2023-10-09 NOTE — Progress Notes (Signed)
   10/09/23 1210  Assess: MEWS Score  BP (!) 88/44  MAP (mmHg) (!) 58  Pulse Rate (!) 55  ECG Heart Rate (!) 54  Resp 12  SpO2 96 %  Assess: MEWS Score  MEWS Temp 0  MEWS Systolic 1  MEWS Pulse 0  MEWS RR 1  MEWS LOC 0  MEWS Score 2  MEWS Score Color Yellow  Assess: if the MEWS score is Yellow or Red  Were vital signs accurate and taken at a resting state? Yes  Does the patient meet 2 or more of the SIRS criteria? No  MEWS guidelines implemented  Yes, yellow  Treat  MEWS Interventions Considered administering scheduled or prn medications/treatments as ordered  Take Vital Signs  Increase Vital Sign Frequency  Yellow: Q2hr x1, continue Q4hrs until patient remains green for 12hrs  Escalate  MEWS: Escalate Yellow: Discuss with charge nurse and consider notifying provider and/or RRT  Notify: Charge Nurse/RN  Name of Charge Nurse/RN Notified Lindi Adie RN  Provider Notification  Provider Name/Title Berton Mount  Date Provider Notified 10/09/23  Time Provider Notified 1216  Method of Notification Page  Notification Reason Change in status  Provider response No new orders (Continue to monitor if asymptomatic)  Date of Provider Response 10/09/23  Time of Provider Response 1217  Assess: SIRS CRITERIA  SIRS Temperature  0  SIRS Respirations  0  SIRS Pulse 0  SIRS WBC 0  SIRS Score Sum  0

## 2023-10-10 ENCOUNTER — Inpatient Hospital Stay (HOSPITAL_COMMUNITY): Payer: Medicare Other

## 2023-10-10 DIAGNOSIS — I2111 ST elevation (STEMI) myocardial infarction involving right coronary artery: Secondary | ICD-10-CM | POA: Diagnosis not present

## 2023-10-10 DIAGNOSIS — E039 Hypothyroidism, unspecified: Secondary | ICD-10-CM | POA: Diagnosis not present

## 2023-10-10 DIAGNOSIS — I2119 ST elevation (STEMI) myocardial infarction involving other coronary artery of inferior wall: Secondary | ICD-10-CM | POA: Diagnosis not present

## 2023-10-10 DIAGNOSIS — J101 Influenza due to other identified influenza virus with other respiratory manifestations: Secondary | ICD-10-CM | POA: Diagnosis not present

## 2023-10-10 DIAGNOSIS — L8990 Pressure ulcer of unspecified site, unspecified stage: Secondary | ICD-10-CM | POA: Insufficient documentation

## 2023-10-10 LAB — GLUCOSE, CAPILLARY
Glucose-Capillary: 141 mg/dL — ABNORMAL HIGH (ref 70–99)
Glucose-Capillary: 131 mg/dL — ABNORMAL HIGH (ref 70–99)

## 2023-10-10 LAB — LIPOPROTEIN A (LPA): Lipoprotein (a): <8.4 nmol/L (ref <75.0)

## 2023-10-10 LAB — CBC
HCT: 37.8 % (ref 36.0–46.0)
Hemoglobin: 12.4 g/dL (ref 12.0–15.0)
MCH: 27.3 pg (ref 26.0–34.0)
MCHC: 32.8 g/dL (ref 30.0–36.0)
MCV: 83.3 fL (ref 80.0–100.0)
Platelets: 199 10*3/uL (ref 150–400)
RBC: 4.54 MIL/uL (ref 3.87–5.11)
RDW: 14.4 % (ref 11.5–15.5)
WBC: 8.9 10*3/uL (ref 4.0–10.5)
nRBC: 0 % (ref 0.0–0.2)

## 2023-10-10 LAB — HEPARIN LEVEL (UNFRACTIONATED): Heparin Unfractionated: 0.25 [IU]/mL — ABNORMAL LOW (ref 0.30–0.70)

## 2023-10-10 MED ORDER — GLYCOPYRROLATE 0.2 MG/ML IJ SOLN
0.1000 mg | Freq: Once | INTRAMUSCULAR | Status: AC
  Administered 2023-10-10: 0.1 mg via INTRAVENOUS
  Filled 2023-10-10: qty 1

## 2023-10-10 MED ORDER — ACETAMINOPHEN 650 MG RE SUPP: 650.0000 mg | Freq: Three times a day (TID) | RECTAL | Status: DC | PRN

## 2023-10-10 MED ORDER — METOPROLOL SUCCINATE ER 25 MG PO TB24: 25.0000 mg | ORAL_TABLET | Freq: Every day | ORAL | Status: DC

## 2023-10-10 MED ORDER — MORPHINE SULFATE (PF) 2 MG/ML IV SOLN
0.5000 mg | INTRAVENOUS | Status: DC | PRN
  Administered 2023-10-10: 0.5 mg via INTRAVENOUS
  Administered 2023-10-10: 1 mg via INTRAVENOUS
  Filled 2023-10-10 (×2): qty 1

## 2023-10-10 MED ORDER — SODIUM CHLORIDE 0.9 % IV SOLN
INTRAVENOUS | Status: DC
Start: 2023-10-10 — End: 2023-10-11

## 2023-10-10 MED ORDER — MORPHINE SULFATE (PF) 2 MG/ML IV SOLN
0.5000 mg | INTRAVENOUS | Status: DC | PRN
  Administered 2023-10-10 – 2023-10-11 (×4): 1 mg via INTRAVENOUS
  Filled 2023-10-10 (×4): qty 1

## 2023-10-10 MED ORDER — ASPIRIN 81 MG PO TBEC: 81.0000 mg | DELAYED_RELEASE_TABLET | Freq: Every day | ORAL | Status: DC

## 2023-10-10 NOTE — Plan of Care (Signed)
  Problem: Education: Goal: Ability to describe self-care measures that may prevent or decrease complications (Diabetes Survival Skills Education) will improve Outcome: Progressing Goal: Individualized Educational Video(s) Outcome: Progressing   Problem: Coping: Goal: Ability to adjust to condition or change in health will improve Outcome: Progressing   Problem: Fluid Volume: Goal: Ability to maintain a balanced intake and output will improve Outcome: Progressing   Problem: Health Behavior/Discharge Planning: Goal: Ability to identify and utilize available resources and services will improve Outcome: Progressing   Problem: Skin Integrity: Goal: Risk for impaired skin integrity will decrease Outcome: Progressing   Problem: Activity: Goal: Risk for activity intolerance will decrease Outcome: Progressing   Problem: Nutrition: Goal: Adequate nutrition will be maintained Outcome: Progressing   Problem: Coping: Goal: Level of anxiety will decrease Outcome: Progressing   Problem: Elimination: Goal: Will not experience complications related to bowel motility Outcome: Progressing Goal: Will not experience complications related to urinary retention Outcome: Progressing   Problem: Pain Management: Goal: General experience of comfort will improve Outcome: Progressing   Problem: Safety: Goal: Ability to remain free from injury will improve Outcome: Progressing   Problem: Skin Integrity: Goal: Risk for impaired skin integrity will decrease Outcome: Progressing

## 2023-10-10 NOTE — Progress Notes (Signed)
[facility]                                                                               Lacey Kemp, is a 89 y.o. female, DOB - 09-29-1931, ZOX:[id number] Admit date - 10/08/2023    Outpatient Primary MD for the patient is [name], [name], PA  LOS - 2  days    Brief summary    89 year old female with past medical history significant for coronary artery disease s/p PCI to RCA, hyperlipidemia, diabetes mellitus, hypothyroidism and breast cancer status postlumpectomy.  Patient has been exposed to immediate family with flulike illness.  Patient presented with weakness, , chest pain and elevated troponin (1023-20,223).  EKG on presentation revealed STEMI (inferior wall).  Patient was seen by the cardiology team and conservative management has been elected.  Patient is influenza A positive.   Unfortunately patient continues to decline, not following commands not safe to administer oral meds, will request palliative care consult as per family request.   Assessment & Plan    Assessment and Plan:   Inferior wall MI Cardiology on board. Patient is on conservative management at this time.  She was started on IV heparin to remain for 48 hours and then discontinued later this afternoon.  Continue with aspirin 81 mg, Plavix 75 mg, metoprolol 25 mg daily and Crestor 10 mg daily if able to take oral meds by mouth    Patient is influenza A positive Currently on Tamiflu    Hypothyroidism Patient is on Synthroid 75 mcg daily.    Patient continues to decline clinically, will request palliative care consult for goals of care.        RN Pressure Injury Documentation: Pressure Injury 10/08/23 Coccyx Left;Right Stage 1 -  Intact skin with non-blanchable redness of a localized area usually over a bony prominence. pink/ red (Active)  10/08/23 1530  Location: Coccyx  Location Orientation: Left;Right  Staging: Stage 1 -  Intact skin with non-blanchable redness of a  localized area usually over a bony prominence.  Wound Description (Comments): pink/ red  Present on Admission: Yes  Dressing Type Foam - Lift dressing to assess site every shift 10/09/23 2100   Foam dressing in place.   Estimated body mass index is 18.88 kg/m as calculated from the following:   Height as of this encounter: 5\' 4"  (1.626 m).   Weight as of this encounter: 49.9 kg.  Code Status: DNR limited.  DVT Prophylaxis:  SCDs Start: 10/08/23 1514   Level of Care: Level of care: Med-Surg Family Communication: Updated patient's daughters at bedside.   Disposition Plan:     Remains inpatient appropriate:  clinical improvement. Awaiting for palliative input.   Procedures:  None.   Consultants:   Cardiology Palliative care.   Antimicrobials:   Anti-infectives (From admission, onward)    Start     Dose/Rate Route Frequency Ordered Stop   10/10/23 1000  oseltamivir (TAMIFLU) capsule 30 mg       Placed in "Followed by" Linked Group   30 mg Oral 2 times daily 10/09/23 1409 10/15/23 0959   10/09/23 1500  oseltamivir (TAMIFLU) capsule 75 mg  Placed in "Followed by" Linked Group   75 mg Oral  Once 10/09/23 1409 10/09/23 1650        Medications  Scheduled Meds:  aspirin EC  81 mg Oral Daily   clopidogrel  75 mg Oral Daily   insulin aspart  0-9 Units Subcutaneous TID WC   levothyroxine  75 mcg Oral Q0600   metoprolol succinate  25 mg Oral Daily   oseltamivir  30 mg Oral BID   rosuvastatin  10 mg Oral Daily   Continuous Infusions:  sodium chloride 75 mL/hr at 10/10/23 1004   heparin 800 Units/hr (10/10/23 0800)   PRN Meds:.acetaminophen, albuterol, HYDROcodone-acetaminophen, morphine injection, nitroGLYCERIN, ondansetron (ZOFRAN) IV, mouth rinse    Subjective:   Florrie Staniszewski was seen and examined today.  She appears to be wincing in pain. And coughing.  Objective:   Vitals:   10/09/23 2116 10/10/23 0014 10/10/23 0555 10/10/23 0732  BP: (!) 112/53  105/63 99/71 (!) 113/52  Pulse: 67 66 77 76  Resp:  18 19 18   Temp:  98.2 F (36.8 C) 98 F (36.7 C) 98.9 F (37.2 C)  TempSrc:  Axillary Oral Oral  SpO2:  94% 92% 97%  Weight:      Height:        Intake/Output Summary (Last 24 hours) at 10/10/2023 1114 Last data filed at 10/10/2023 0800 Gross per 24 hour  Intake 636 ml  Output 110 ml  Net 526 ml   Filed Weights   10/08/23 1403 10/08/23 1625  Weight: 59 kg 49.9 kg     Exam General exam: frail ill appearing elderly woman, on [place] oxygen in mild distress  Respiratory system: Clear to auscultation. Respiratory effort normal. Cardiovascular system: S1 & S2 heard, RRR. No JVD,  Gastrointestinal system: Abdomen is nondistended, soft and nontender. Central nervous system: Alert but not following commands.  Extremities: no edema.  Skin: No rashes,  Psychiatry: unable to assess.   Data Reviewed:  I have personally reviewed following labs and imaging studies   CBC Lab Results  Component Value Date   WBC 8.9 10/10/2023   RBC 4.54 10/10/2023   HGB 12.4 10/10/2023   HCT 37.8 10/10/2023   MCV 83.3 10/10/2023   MCH 27.3 10/10/2023   PLT 199 10/10/2023   MCHC 32.8 10/10/2023   RDW 14.4 10/10/2023   LYMPHSABS 1.8 10/08/2023   MONOABS 0.6 10/08/2023   EOSABS 0.0 10/08/2023   BASOSABS 0.0 10/08/2023     Last metabolic panel Lab Results  Component Value Date   NA 134 (L) 10/08/2023   K 4.2 10/08/2023   CL 100 10/08/2023   CO2 20 (L) 10/08/2023   BUN 13 10/08/2023   CREATININE 0.83 10/08/2023   GLUCOSE 147 (H) 10/08/2023   GFRNONAA >60 10/08/2023   GFRAA 55 (L) 06/24/2020   CALCIUM 9.0 10/08/2023   PROT 6.7 10/08/2023   ALBUMIN 3.3 (L) 10/08/2023   LABGLOB 3.7 05/05/2021   AGRATIO 1.5 05/05/2021   BILITOT 0.7 10/08/2023   ALKPHOS 68 10/08/2023   AST 28 10/08/2023   ALT 9 10/08/2023   ANIONGAP 14 10/08/2023    CBG (last 3)  Recent Labs    10/09/23 1704 10/09/23 2122 10/10/23 0730  GLUCAP 249* 177* 141*       Coagulation Profile: Recent Labs  Lab 10/08/23 1350  INR 1.2     Radiology Studies: [facility] Chest Port 1 View Result Date: 10/08/2023 CLINICAL DATA:  STEMI EXAM: PORTABLE CHEST 1 VIEW COMPARISON:  01/28/2023,  01/13/2022 FINDINGS: Low lung volumes. Cardiomegaly with aortic atherosclerosis.scarring within the bilateral lungs with suspected fibrosis. Increased slightly nodular opacity in the right mid lung compared to prior. No pleural effusion or pneumothorax. IMPRESSION: Low lung volumes with scarring and suspected fibrosis. Increased slightly nodular opacity in the right mid lung compared to prior, recommend follow-up two-view chest x-ray versus CT for further assessment Electronically Signed   By: Jasmine Pang M.D.   On: 10/08/2023 15:43       Kathlen Mody M.D. [facility] 10/10/2023, 11:14 AM  Available via Epic secure chat 7am-7pm After 7 pm, please refer to night coverage provider listed on amion.

## 2023-10-10 NOTE — Progress Notes (Signed)
PHARMACY - ANTICOAGULATION CONSULT NOTE  Pharmacy Consult for heparin infusion Indication: chest pain/ACS  No Known Allergies  Patient Measurements: Height: 5\' 4"  (162.6 cm) Weight: 49.9 kg (110 lb 0.2 oz) IBW/kg (Calculated) : 54.7 Heparin Dosing Weight: 59 kg  Vital Signs: Temp: 98 F (36.7 C) (12/16 0555) Temp Source: Oral (12/16 0555) BP: 99/71 (12/16 0555) Pulse Rate: 77 (12/16 0555)  Labs: Recent Labs    10/08/23 1350 10/08/23 1559 10/08/23 2329 10/09/23 0456 10/09/23 1027 10/10/23 0442  HGB 13.7  --   --   --  12.8 12.4  HCT 42.0  --   --   --  39.6 37.8  PLT 211  --   --   --  190 199  APTT 30  --   --   --   --   --   LABPROT 15.0  --   --   --   --   --   INR 1.2  --   --   --   --   --   HEPARINUNFRC  --   --    < > 0.44 0.41 0.25*  CREATININE 0.83  --   --   --   --   --   TROPONINIHS 1,023* 20,223*  --   --   --   --    < > = values in this interval not displayed.    Estimated Creatinine Clearance: 35.5 mL/min (by C-G formula based on SCr of 0.83 mg/dL).  Assessment: 89 yo F presents to ED with c/o flu like symptoms and chest pain. ECG consistent with acute inferior STEMI. Decision was made to not undergo intervention and pursue medical management only - will be 48h of heparin today @1430 . Pt does not take any anticoagulation at home. Pharmacy consulted to dose heparin infusion per ACS protocol.   Heparin level 0.25 subtherapeutic on 650 units/hr.  No issues noted per RN.  CBC stable.    Goal of Therapy:  Heparin level 0.3-0.7 units/ml Monitor platelets by anticoagulation protocol: Yes   Plan:  Increase heparin infusion to 800 units/hr  F/u stop time at 1430 today - 48h DOT for med management  Trixie Rude, PharmD Clinical Pharmacist 10/10/2023  7:14 AM

## 2023-10-10 NOTE — Plan of Care (Signed)
Plan of care for patient transitioned to comfort care. Hospice consult made.  Problem: Pain Management: Goal: General experience of comfort will improve Outcome: Progressing

## 2023-10-10 NOTE — Progress Notes (Addendum)
   Patient Name: Lacey Kemp Date of Encounter: 10/10/2023 [facility] Cardiologist: Armanda Magic, MD   Interval Summary  .    Patient alert to voice on exam today but is very fatigued and frail appearing. No focal complaints reported.   Vital Signs .    Vitals:   10/09/23 2116 10/10/23 0014 10/10/23 0555 10/10/23 0732  BP: (!) 112/53 105/63 99/71 (!) 113/52  Pulse: 67 66 77 76  Resp:  18 19 18   Temp:  98.2 F (36.8 C) 98 F (36.7 C) 98.9 F (37.2 C)  TempSrc:  Axillary Oral Oral  SpO2:  94% 92% 97%  Weight:      Height:        Intake/Output Summary (Last 24 hours) at 10/10/2023 0758 Last data filed at 10/10/2023 0617 Gross per 24 hour  Intake 723.7 ml  Output 160 ml  Net 563.7 ml      10/08/2023    4:25 PM 10/08/2023    2:03 PM 01/28/2023   10:35 PM  Last 3 Weights  Weight (lbs) 110 lb 0.2 oz 130 lb 127 lb 13.9 oz  Weight (kg) 49.9 kg 58.968 kg 58 kg      Telemetry/ECG    Sinus rhythm - Personally Reviewed  Physical Exam .   GEN: No acute distress.   Neck: No JVD Cardiac: RRR, no murmurs, rubs, or gallops.  Respiratory: diffuse rhonchi GI: Soft, nontender, non-distended  MS: No edema  Assessment & Plan .     Lacey Kemp is a 89 year old female with history of breast cancer, CAD with prior stenting of the RCA in 2016, DM, HLD, frailty and mild dementia who presented to the [facility] ED by EMS after being called with complaints of weakness and chills.   Acute inferior MI  Patient presented to the ED with weakness and chills. Patient lives with daughter, family has been sick this past week. Patient found with influenza A. ED EKG with inferior ST elevation c/w acute MI. She is a DNR and her daughter states that she does not wish to have invasive procedures performed. This was also documented in the office visit note from May 2023 at which time the patient and her daughter did not wish to have any stress testing or further invasive procedures.    Continue heparin through 2:30pm today for full 48 hours. Continue ASA/Plavix for medical management of STEMI Transition from Metoprolol tartrate to succinate 25mg  Continue Crestor 10mg   Hyperlipidemia  Continue Crestor 10mg .   DM type II  Per primary team  Influenza A  Per primary team.   For questions or updates, please contact [facility] Please consult [email or web address] for contact info under        Signed, Perlie Gold, PA-C    ATTENDING ATTESTATION:  After conducting a review of all available clinical information with the care team, interviewing the patient, and performing a physical exam, I agree with the findings and plan described in this note.   GEN: AOx1 HEENT:  MMM, no JVD, no scleral icterus, dry MM Cardiac: RRR, no murmurs, rubs, or gallops.  Respiratory: Clear to auscultation bilaterally. GI: Soft, nontender, non-distended  MS: No edema; No deformity. Neuro:  Nonfocal  Vasc:  +2 radial pulses  More disoriented today.  Cont medical treatment for STEMI with ASA, Plavix, hep gtt x 48hr (d/c later today).  Agree with palliative care consult.    Alverda Skeans, MD Pager [phone]

## 2023-10-11 DIAGNOSIS — J101 Influenza due to other identified influenza virus with other respiratory manifestations: Secondary | ICD-10-CM | POA: Diagnosis not present

## 2023-10-11 DIAGNOSIS — I2119 ST elevation (STEMI) myocardial infarction involving other coronary artery of inferior wall: Secondary | ICD-10-CM | POA: Diagnosis not present

## 2023-10-11 DIAGNOSIS — Z7189 Other specified counseling: Secondary | ICD-10-CM

## 2023-10-11 MED ORDER — GLYCOPYRROLATE 1 MG PO TABS: 1.0000 mg | ORAL_TABLET | ORAL | Status: DC | PRN

## 2023-10-11 MED ORDER — MORPHINE SULFATE (PF) 2 MG/ML IV SOLN
2.0000 mg | INTRAVENOUS | Status: DC | PRN
  Administered 2023-10-11 – 2023-10-12 (×5): 2 mg via INTRAVENOUS
  Filled 2023-10-11 (×6): qty 1

## 2023-10-11 MED ORDER — LORAZEPAM 1 MG PO TABS
1.0000 mg | ORAL_TABLET | ORAL | Status: DC | PRN
Start: 2023-10-11 — End: 2023-10-12

## 2023-10-11 MED ORDER — LORAZEPAM 2 MG/ML PO CONC: 1.0000 mg | ORAL | Status: DC | PRN

## 2023-10-11 MED ORDER — LORAZEPAM 2 MG/ML IJ SOLN
1.0000 mg | INTRAMUSCULAR | Status: DC | PRN
  Administered 2023-10-12 (×2): 1 mg via INTRAVENOUS
  Filled 2023-10-11 (×3): qty 1

## 2023-10-11 MED ORDER — GLYCOPYRROLATE 0.2 MG/ML IJ SOLN
0.2000 mg | INTRAMUSCULAR | Status: DC | PRN
  Administered 2023-10-11 – 2023-10-12 (×4): 0.2 mg via INTRAVENOUS
  Filled 2023-10-11 (×4): qty 1

## 2023-10-11 MED ORDER — GLYCOPYRROLATE 0.2 MG/ML IJ SOLN: 0.2000 mg | INTRAMUSCULAR | Status: DC | PRN

## 2023-10-11 NOTE — TOC Initial Note (Addendum)
Transition of Care [facility]) - Initial/Assessment Note    Patient Details  Name: Lacey Kemp MRN: [id number] Date of Birth: 03-12-1931  Transition of Care [facility]) CM/SW Contact:    Delilah Shan, LCSWA Phone Number: 10/11/2023, 10:24 AM  Clinical Narrative:                  CSW received consult from MD for residential hospice referral for for patient. [place] and [place] patients daughter both agreeable for CSW to make referral to [facility]. CSW made referral to [facility] And Shawn P. With [facility] for [facility] for patient. CSW will continue to follow.    Update- CSW received message from [place] with [facility] who informed CSW that patient has been approved for [facility] and they have a bed available today.CSW informed by Efraim Kaufmann with [facility] family wants to tour facility. CSW informed MD. CSW will continue to follow.  Update- 5:30pm- Melissa with [facility]  informed CSW that family is currently undecided on residential hospice facility choice and would like to keep patient admitted if possible. MD and RN informed.     Patient Goals and CMS Choice            Expected Discharge Plan and Services                                              Prior Living Arrangements/Services                       Activities of Daily Living   ADL Screening (condition at time of admission) Independently performs ADLs?: No Does the patient have a NEW difficulty with bathing/dressing/toileting/self-feeding that is expected to last >3 days?: No Does the patient have a NEW difficulty with getting in/out of bed, walking, or climbing stairs that is expected to last >3 days?: No Does the patient have a NEW difficulty with communication that is expected to last >3 days?: No Is the patient deaf or have difficulty hearing?: Yes Does the patient have difficulty seeing, even when wearing glasses/contacts?: Yes Does the patient have difficulty concentrating,  remembering, or making decisions?: Yes  Permission Sought/Granted                  Emotional Assessment              Admission diagnosis:  STEMI (ST elevation myocardial infarction) (HCC) [I21.3] ST elevation myocardial infarction involving right coronary artery (HCC) [I21.11] Patient Active Problem List   Diagnosis Date Noted   Pressure injury of skin 10/10/2023   Influenza A 10/10/2023   STEMI (ST elevation myocardial infarction) (HCC) 10/08/2023   Breast CA (HCC) 12/08/2015   MI, old 11/04/2015   Diabetes (HCC) 08/08/2015   Hypothyroidism 08/08/2015   CAD (coronary artery disease) 08/08/2015   S/P right coronary artery (RCA) stent placement 08/08/2015   Osteoporosis 08/08/2015   Dyslipidemia, goal LDL below 70    PCP:  Delma Officer, PA Pharmacy:   [id number] [place], [place] - [address] [place] [address] Phone: [phone] Fax: [phone]     Social Drivers of Health (SDOH) Social History: SDOH Screenings   Food Insecurity: No Food Insecurity (10/08/2023)  Housing: Low Risk  (10/08/2023)  Transportation Needs: No Transportation Needs (10/08/2023)  Utilities: Not  At Risk (10/08/2023)  Tobacco Use: Medium Risk (10/08/2023)   SDOH Interventions:     Readmission Risk Interventions     No data to display

## 2023-10-11 NOTE — Plan of Care (Signed)
  Problem: Education: Goal: Ability to describe self-care measures that may prevent or decrease complications (Diabetes Survival Skills Education) will improve Outcome: Progressing Goal: Individualized Educational Video(s) Outcome: Progressing   Problem: Coping: Goal: Ability to adjust to condition or change in health will improve Outcome: Progressing   Problem: Fluid Volume: Goal: Ability to maintain a balanced intake and output will improve Outcome: Progressing   Problem: Health Behavior/Discharge Planning: Goal: Ability to identify and utilize available resources and services will improve Outcome: Progressing   Problem: Skin Integrity: Goal: Risk for impaired skin integrity will decrease Outcome: Progressing   Problem: Activity: Goal: Risk for activity intolerance will decrease Outcome: Progressing   Problem: Nutrition: Goal: Adequate nutrition will be maintained Outcome: Progressing   Problem: Coping: Goal: Level of anxiety will decrease Outcome: Progressing   Problem: Elimination: Goal: Will not experience complications related to bowel motility Outcome: Progressing Goal: Will not experience complications related to urinary retention Outcome: Progressing   Problem: Pain Management: Goal: General experience of comfort will improve Outcome: Progressing   Problem: Safety: Goal: Ability to remain free from injury will improve Outcome: Progressing   Problem: Skin Integrity: Goal: Risk for impaired skin integrity will decrease Outcome: Progressing   Problem: Education: Goal: Knowledge of the prescribed therapeutic regimen will improve Outcome: Progressing   Problem: Coping: Goal: Ability to identify and develop effective coping behavior will improve Outcome: Progressing   Problem: Clinical Measurements: Goal: Quality of life will improve Outcome: Progressing   Problem: Respiratory: Goal: Verbalizations of increased ease of respirations will  increase Outcome: Progressing   Problem: Role Relationship: Goal: Family's ability to cope with current situation will improve Outcome: Progressing Goal: Ability to verbalize concerns, feelings, and thoughts to partner or family member will improve Outcome: Progressing   Problem: Pain Management: Goal: Satisfaction with pain management regimen will improve Outcome: Progressing

## 2023-10-11 NOTE — Discharge Summary (Signed)
Physician Discharge Summary   Lacey Kemp WGN:[id number] DOB: May 30, 1931 DOA: 10/08/2023  PCP: Delma Officer, PA  Admit date: 10/08/2023 Discharge date: 10/12/2023  Admitted From: Home Disposition:  [place] Discharging physician: Lewie Chamber, MD Barriers to discharge: none  Recommendations at discharge: Continue comfort care  Discharge Condition: fair CODE STATUS: DNR-comfort   Hospital Course: 89 year old female with past medical history significant for coronary artery disease s/p PCI to RCA, hyperlipidemia, diabetes mellitus, hypothyroidism and breast cancer status postlumpectomy.  Patient has been exposed to immediate family with flulike illness.  Patient presented with weakness, chest pain and elevated troponin (1023-20,223).  EKG on presentation revealed STEMI (inferior wall).  Patient was seen by the cardiology team and conservative management has been elected.  Patient is influenza A positive.   Goals of care discussions were held and family elected for transitioning to hospice with comfort care.  Assessment and Plan:  STEMI CAD - followed by cardiology -Medical management recommended initially - After further GOC discussions, family wishes to transition to comfort care measures -Will discontinue cardiac meds at this time and focus on comfort   GOC - given poor prognosis and overall failure to thrive; poor oral intake and ongoing weight loss, family has elected for transition to hospice at this time   Influenza A - d/c tamiflu; patient not taking PO safely at this time    Hypothyroidism -Continue comfort care     RN Pressure Injury Documentation:        Pressure Injury 10/08/23 Coccyx Left;Right Stage 1 -  Intact skin with non-blanchable redness of a localized area usually over a bony prominence. pink/ red (Active)  10/08/23 1530  Location: Coccyx  Location Orientation: Left;Right  Staging: Stage 1 -  Intact skin with non-blanchable redness  of a localized area usually over a bony prominence.  Wound Description (Comments): pink/ red  Present on Admission: Yes  Dressing Type Foam - Lift dressing to assess site every shift 10/09/23 2100    Foam dressing in place.    Estimated body mass index is 18.88 kg/m as calculated from the following:   Height as of this encounter: 5\' 4"  (1.626 m).   Weight as of this encounter: 49.9 kg.   Principal Diagnosis: STEMI (ST elevation myocardial infarction) [facility])  Discharge Diagnoses: Active Hospital Problems   Diagnosis Date Noted   STEMI (ST elevation myocardial infarction) (HCC) 10/08/2023    Priority: 1.   Influenza A 10/10/2023    Priority: 2.   Goals of care, counseling/discussion 10/11/2023    Priority: 3.   Pressure injury of skin 10/10/2023   Hypothyroidism 08/08/2015   Dyslipidemia, goal LDL below 70     Resolved Hospital Problems  No resolved problems to display.     Discharge Instructions     Increase activity slowly   Complete by: As directed    No wound care   Complete by: As directed    No wound care   Complete by: As directed       Allergies as of 10/12/2023   No Known Allergies      Medication List     STOP taking these medications    acetaminophen 500 MG tablet Commonly known as: TYLENOL   albuterol 108 (90 Base) MCG/ACT inhaler Commonly known as: VENTOLIN HFA   clopidogrel 75 MG tablet Commonly known as: PLAVIX   levothyroxine 75 MCG tablet Commonly known as: SYNTHROID   rosuvastatin 5 MG tablet Commonly known as: CRESTOR   sitaGLIPtin 50  MG tablet Commonly known as: JANUVIA        No Known Allergies   Discharge Exam: BP (!) 90/51 (BP Location: Right Arm)   Pulse (!) 102   Temp (!) 100.7 F (38.2 C) (Axillary)   Resp (!) 21   Ht 5\' 4"  (1.626 m)   Wt 49.9 kg   SpO2 91%   BMI 18.88 kg/m  Physical Exam Constitutional:      Comments: Even less responsive today.  Opens eyes to touch but unable to talk or follow commands    HENT:     Head: Normocephalic and atraumatic.     Mouth/Throat:     Mouth: Mucous membranes are dry.  Eyes:     Extraocular Movements: Extraocular movements intact.  Cardiovascular:     Rate and Rhythm: Normal rate and regular rhythm.  Pulmonary:     Effort: Pulmonary effort is normal. No respiratory distress.     Breath sounds: No wheezing.     Comments: Breathing pattern slightly more irregular Abdominal:     General: Bowel sounds are normal. There is no distension.     Palpations: Abdomen is soft.     Tenderness: There is no abdominal tenderness.  Musculoskeletal:        General: No swelling.     Cervical back: Normal range of motion and neck supple.  Skin:    General: Skin is warm and dry.  Neurological:     Comments: Nonverbal.  No longer following commands      The results of significant diagnostics from this hospitalization (including imaging, microbiology, ancillary and laboratory) are listed below for reference.   Microbiology: Recent Results (from the past 240 hours)  Resp panel by RT-PCR (RSV, Flu A&B, Covid) Anterior Nasal Swab     Status: Abnormal   Collection Time: 10/08/23  2:03 PM   Specimen: Anterior Nasal Swab  Result Value Ref Range Status   SARS Coronavirus 2 by RT PCR NEGATIVE NEGATIVE Final   Influenza A by PCR POSITIVE (A) NEGATIVE Final   Influenza B by PCR NEGATIVE NEGATIVE Final    Comment: (NOTE) The Xpert Xpress SARS-CoV-2/FLU/RSV plus assay is intended as an aid in the diagnosis of influenza from Nasopharyngeal swab specimens and should not be used as a sole basis for treatment. Nasal washings and aspirates are unacceptable for Xpert Xpress SARS-CoV-2/FLU/RSV testing.  Fact Sheet for Patients: BloggerCourse.com  Fact Sheet for Healthcare Providers: SeriousBroker.it  This test is not yet approved or cleared by the [place] FDA and has been authorized for detection and/or diagnosis of  SARS-CoV-2 by FDA under an Emergency Use Authorization (EUA). This EUA will remain in effect (meaning this test can be used) for the duration of the COVID-19 declaration under Section 564(b)(1) of the Act, 21 U.S.C. section 360bbb-3(b)(1), unless the authorization is terminated or revoked.     Resp Syncytial Virus by PCR NEGATIVE NEGATIVE Final    Comment: (NOTE) Fact Sheet for Patients: BloggerCourse.com  Fact Sheet for Healthcare Providers: SeriousBroker.it  This test is not yet approved or cleared by the [place] FDA and has been authorized for detection and/or diagnosis of SARS-CoV-2 by FDA under an Emergency Use Authorization (EUA). This EUA will remain in effect (meaning this test can be used) for the duration of the COVID-19 declaration under Section 564(b)(1) of the Act, 21 U.S.C. section 360bbb-3(b)(1), unless the authorization is terminated or revoked.  Performed at [facility], [address], [place], [place] [address]  Labs: BNP (last 3 results) No results for input(s): "BNP" in the last 8760 hours. Basic Metabolic Panel: Recent Labs  Lab 10/08/23 1350  NA 134*  K 4.2  CL 100  CO2 20*  GLUCOSE 147*  BUN 13  CREATININE 0.83  CALCIUM 9.0   Liver Function Tests: Recent Labs  Lab 10/08/23 1350  AST 28  ALT 9  ALKPHOS 68  BILITOT 0.7  PROT 6.7  ALBUMIN 3.3*   No results for input(s): "LIPASE", "AMYLASE" in the last 168 hours. No results for input(s): "AMMONIA" in the last 168 hours. CBC: Recent Labs  Lab 10/08/23 1350 10/09/23 1027 10/10/23 0442  WBC 10.6* 11.2* 8.9  NEUTROABS 8.0*  --   --   HGB 13.7 12.8 12.4  HCT 42.0 39.6 37.8  MCV 85.0 84.3 83.3  PLT 211 190 199   Cardiac Enzymes: No results for input(s): "CKTOTAL", "CKMB", "CKMBINDEX", "TROPONINI" in the last 168 hours. BNP: Invalid input(s): "POCBNP" CBG: Recent Labs  Lab 10/09/23 1149 10/09/23 1704  10/09/23 2122 10/10/23 0730 10/10/23 1125  GLUCAP 133* 249* 177* 141* 131*   D-Dimer No results for input(s): "DDIMER" in the last 72 hours. Hgb A1c No results for input(s): "HGBA1C" in the last 72 hours. Lipid Profile No results for input(s): "CHOL", "HDL", "LDLCALC", "TRIG", "CHOLHDL", "LDLDIRECT" in the last 72 hours. Thyroid function studies No results for input(s): "TSH", "T4TOTAL", "T3FREE", "THYROIDAB" in the last 72 hours.  Invalid input(s): "FREET3" Anemia work up No results for input(s): "VITAMINB12", "FOLATE", "FERRITIN", "TIBC", "IRON", "RETICCTPCT" in the last 72 hours. Urinalysis No results found for: "COLORURINE", "APPEARANCEUR", "LABSPEC", "PHURINE", "GLUCOSEU", "HGBUR", "BILIRUBINUR", "KETONESUR", "PROTEINUR", "UROBILINOGEN", "NITRITE", "LEUKOCYTESUR" Sepsis Labs Recent Labs  Lab 10/08/23 1350 10/09/23 1027 10/10/23 0442  WBC 10.6* 11.2* 8.9   Microbiology Recent Results (from the past 240 hours)  Resp panel by RT-PCR (RSV, Flu A&B, Covid) Anterior Nasal Swab     Status: Abnormal   Collection Time: 10/08/23  2:03 PM   Specimen: Anterior Nasal Swab  Result Value Ref Range Status   SARS Coronavirus 2 by RT PCR NEGATIVE NEGATIVE Final   Influenza A by PCR POSITIVE (A) NEGATIVE Final   Influenza B by PCR NEGATIVE NEGATIVE Final    Comment: (NOTE) The Xpert Xpress SARS-CoV-2/FLU/RSV plus assay is intended as an aid in the diagnosis of influenza from Nasopharyngeal swab specimens and should not be used as a sole basis for treatment. Nasal washings and aspirates are unacceptable for Xpert Xpress SARS-CoV-2/FLU/RSV testing.  Fact Sheet for Patients: BloggerCourse.com  Fact Sheet for Healthcare Providers: SeriousBroker.it  This test is not yet approved or cleared by the [place] FDA and has been authorized for detection and/or diagnosis of SARS-CoV-2 by FDA under an Emergency Use Authorization (EUA).  This EUA will remain in effect (meaning this test can be used) for the duration of the COVID-19 declaration under Section 564(b)(1) of the Act, 21 U.S.C. section 360bbb-3(b)(1), unless the authorization is terminated or revoked.     Resp Syncytial Virus by PCR NEGATIVE NEGATIVE Final    Comment: (NOTE) Fact Sheet for Patients: BloggerCourse.com  Fact Sheet for Healthcare Providers: SeriousBroker.it  This test is not yet approved or cleared by the [place] FDA and has been authorized for detection and/or diagnosis of SARS-CoV-2 by FDA under an Emergency Use Authorization (EUA). This EUA will remain in effect (meaning this test can be used) for the duration of the COVID-19 declaration under Section 564(b)(1) of the Act, 21 U.S.C. section 360bbb-3(b)(1),  unless the authorization is terminated or revoked.  Performed at [facility], [address], [place], [place] [address]     Procedures/Studies: [place] CHEST PORT 1 VIEW Result Date: 10/10/2023 CLINICAL DATA:  Dyspnea. EXAM: PORTABLE CHEST 1 VIEW COMPARISON:  Radiographs 10/08/2023 and 01/28/2023. FINDINGS: 1250 hours. Persistent low lung volumes with patient rotation to the right and elevation of the right hemidiaphragm. As suggested on the recent chest radiographs, there is increased density over the right hilum which could reflect a developing mass or underlying focal airspace disease. Fibrotic changes in both lungs are otherwise similar. No evidence of pleural effusion or pneumothorax. The bones appear unchanged, without acute findings. IMPRESSION: 1. Persistent increased density over the right hilum which could reflect a developing mass or underlying focal airspace disease. Recommend further evaluation with contrast-enhanced chest CT. 2. Persistent low lung volumes with fibrotic changes in both lungs. Electronically Signed   By: Carey Bullocks M.D.   On: 10/10/2023 16:45    DG Chest Port 1 View Result Date: 10/08/2023 CLINICAL DATA:  STEMI EXAM: PORTABLE CHEST 1 VIEW COMPARISON:  01/28/2023, 01/13/2022 FINDINGS: Low lung volumes. Cardiomegaly with aortic atherosclerosis.scarring within the bilateral lungs with suspected fibrosis. Increased slightly nodular opacity in the right mid lung compared to prior. No pleural effusion or pneumothorax. IMPRESSION: Low lung volumes with scarring and suspected fibrosis. Increased slightly nodular opacity in the right mid lung compared to prior, recommend follow-up two-view chest x-ray versus CT for further assessment Electronically Signed   By: Jasmine Pang M.D.   On: 10/08/2023 15:43     Time coordinating discharge: Over 30 minutes    Lewie Chamber, MD  [facility] 10/12/2023, 3:22 PM

## 2023-10-11 NOTE — Progress Notes (Signed)
Progress Note    Lacey Kemp   UJW:[id number]  DOB: 1930-11-28  DOA: 10/08/2023     3 PCP: Delma Officer, PA  Initial CC: Weakness, chest pain  Hospital Course: 89 year old female with past medical history significant for coronary artery disease s/p PCI to RCA, hyperlipidemia, diabetes mellitus, hypothyroidism and breast cancer status postlumpectomy.  Patient has been exposed to immediate family with flulike illness.  Patient presented with weakness, chest pain and elevated troponin (1023-20,223).  EKG on presentation revealed STEMI (inferior wall).  Patient was seen by the cardiology team and conservative management has been elected.  Patient is influenza A positive.   Goals of care discussions were held and family elected for transitioning to hospice with comfort care.  Interval History:  Daughters present this morning.  Patient is mostly nonverbal and noninteractive.  Not eating since at least Saturday.  She does appear mostly comfortable in bed.  Family has discussed and they have elected for transitioning to hospice and keeping patient comfortable.  [facility] is preference.  Assessment and Plan:  STEMI CAD - followed by cardiology -Medical management recommended initially - After further GOC discussions, family wishes to transition to comfort care measures -Will discontinue cardiac meds at this time and focus on comfort  GOC - given poor prognosis and overall failure to thrive; poor oral intake and ongoing weight loss, family has elected for transition to hospice at this time; [facility] is preference   Influenza A - d/c tamiflu; patient not taking PO safely at this time    Hypothyroidism -Continue comfort care       RN Pressure Injury Documentation:     Pressure Injury 10/08/23 Coccyx Left;Right Stage 1 -  Intact skin with non-blanchable redness of a localized area usually over a bony prominence. pink/ red (Active)  10/08/23 1530  Location: Coccyx   Location Orientation: Left;Right  Staging: Stage 1 -  Intact skin with non-blanchable redness of a localized area usually over a bony prominence.  Wound Description (Comments): pink/ red  Present on Admission: Yes  Dressing Type Foam - Lift dressing to assess site every shift 10/09/23 2100    Foam dressing in place.    Estimated body mass index is 18.88 kg/m as calculated from the following:   Height as of this encounter: 5\' 4"  (1.626 m).   Weight as of this encounter: 49.9 kg.   Old records reviewed in assessment of this patient  Antimicrobials:   DVT prophylaxis:  SCDs Start: 10/08/23 1514   Code Status:   Code Status: Do not attempt resuscitation (DNR) - Comfort care  Mobility Assessment (Last 72 Hours)     Mobility Assessment     Row Name 10/10/23 2000 10/10/23 0800 10/09/23 1944 10/09/23 0900 10/08/23 2045   Does patient have an order for bedrest or is patient medically unstable No - Continue assessment No - Continue assessment No - Continue assessment No - Continue assessment No - Continue assessment   What is the highest level of mobility based on the progressive mobility assessment? Level 2 (Chairfast) - Balance while sitting on edge of bed and cannot stand Level 2 (Chairfast) - Balance while sitting on edge of bed and cannot stand Level 2 (Chairfast) - Balance while sitting on edge of bed and cannot stand Level 5 (Walks with assist in room/hall) - Balance while stepping forward/back and can walk in room with assist - Complete Level 2 (Chairfast) - Balance while sitting on edge of bed and cannot stand  Is the above level different from baseline mobility prior to current illness? No - Consider discontinuing PT/OT Yes - Recommend PT order -- No - Consider discontinuing PT/OT Yes - Recommend PT order    Row Name 10/08/23 1530           Does patient have an order for bedrest or is patient medically unstable No - Continue assessment       What is the highest level of  mobility based on the progressive mobility assessment? Level 2 (Chairfast) - Balance while sitting on edge of bed and cannot stand       Is the above level different from baseline mobility prior to current illness? Yes - Recommend PT order                Barriers to discharge: none Disposition Plan:  [facility] Status is: Inpt  Objective: Blood pressure (!) 99/47, pulse 98, temperature (!) 100.7 F (38.2 C), temperature source Axillary, resp. rate (!) 24, height 5\' 4"  (1.626 m), weight 49.9 kg, SpO2 91%.  Examination:  Physical Exam Constitutional:      Comments: Minimally responsive elderly woman resting in bed appearing to be mildly uncomfortable with some facial grimacing but in no distress  HENT:     Head: Normocephalic and atraumatic.     Mouth/Throat:     Mouth: Mucous membranes are dry.  Eyes:     Extraocular Movements: Extraocular movements intact.  Cardiovascular:     Rate and Rhythm: Normal rate and regular rhythm.  Pulmonary:     Effort: Pulmonary effort is normal. No respiratory distress.     Breath sounds: No wheezing.  Abdominal:     General: Bowel sounds are normal. There is no distension.     Palpations: Abdomen is soft.     Tenderness: There is no abdominal tenderness.  Musculoskeletal:        General: No swelling.     Cervical back: Normal range of motion and neck supple.  Skin:    General: Skin is warm and dry.  Neurological:     Comments: Nonverbal.  Does follow some commands with upper extremities      Consultants:  Cardiology   Procedures:    Data Reviewed: No results found for this or any previous visit (from the past 24 hours).  I have reviewed pertinent nursing notes, vitals, labs, and images as necessary. I have ordered labwork to follow up on as indicated.  I have reviewed the last notes from staff over past 24 hours. I have discussed patient's care plan and test results with nursing staff, CM/SW, and other staff as  appropriate.  Time spent: Greater than 50% of the 55 minute visit was spent in counseling/coordination of care for the patient as laid out in the A&P.   LOS: 3 days   Lewie Chamber, MD [facility] 10/11/2023, 1:01 PM

## 2023-10-11 NOTE — Progress Notes (Addendum)
[facility] [phone] [facility] Liaison Note  Received request from [facility] manager for family interest in [facility]. Eligibility confirmed. Met with patient and family to confirm interest and explain services. Bed offered.Family agreed to transfer today then later changed direction and would like to keep patient admitted if possible. TOC, Christina aware.  Please don't hesitate to reach out with questions or concerns.  Thank you for allowing [place] the opportunity to participate in this patient's care.   Henderson Newcomer, LPN [facility] Liaison [phone]

## 2023-10-11 NOTE — Hospital Course (Signed)
89 year old female with past medical history significant for coronary artery disease s/p PCI to RCA, hyperlipidemia, diabetes mellitus, hypothyroidism and breast cancer status postlumpectomy.  Patient has been exposed to immediate family with flulike illness.  Patient presented with weakness, chest pain and elevated troponin (1023-20,223).  EKG on presentation revealed STEMI (inferior wall).  Patient was seen by the cardiology team and conservative management has been elected.  Patient is influenza A positive.   Goals of care discussions were held and family elected for transitioning to hospice with comfort care.

## 2023-10-11 NOTE — TOC Transition Note (Incomplete)
Transition of Care [facility]) - Discharge Note   Patient Details  Name: Lacey Kemp MRN: [id number] Date of Birth: 04-22-1931  Transition of Care [facility]) CM/SW Contact:  Delilah Shan, LCSWA Phone Number: 10/11/2023, 2:29 PM   Clinical Narrative:     Patient will DC to: [facility]  Anticipated DC date: 10/11/2023  Family notified:   Transport by: Sharin Mons  ?  Per MD patient ready for DC to [facility] . RN, patient, Melissa with [facility],and patient's family  notified of DC.  RN given number for report ([phone]. DC packet on chart. DNR signed by MD attached to patients DC packet. Ambulance transport requested for patient.  CSW signing off.   Final next level of care: [facility]) Barriers to Discharge: No Barriers Identified   Patient Goals and CMS Choice     Choice offered to / list presented to : Adult Children (Patients daughters)      Discharge Placement              Patient chooses bed at:  [facility]) Patient to be transferred to facility by: [facility] Name of family member notified: Nicki and [place] Patient and family notified of of transfer: 10/11/23  Discharge Plan and Services Additional resources added to the After Visit Summary for                                       Social Drivers of Health (SDOH) Interventions SDOH Screenings   Food Insecurity: No Food Insecurity (10/08/2023)  Housing: Low Risk  (10/08/2023)  Transportation Needs: No Transportation Needs (10/08/2023)  Utilities: Not At Risk (10/08/2023)  Tobacco Use: Medium Risk (10/08/2023)     Readmission Risk Interventions     No data to display

## 2023-10-12 DIAGNOSIS — I2119 ST elevation (STEMI) myocardial infarction involving other coronary artery of inferior wall: Secondary | ICD-10-CM | POA: Diagnosis not present

## 2023-10-12 DIAGNOSIS — Z7189 Other specified counseling: Secondary | ICD-10-CM | POA: Diagnosis not present

## 2023-10-12 DIAGNOSIS — J101 Influenza due to other identified influenza virus with other respiratory manifestations: Secondary | ICD-10-CM | POA: Diagnosis not present

## 2023-10-12 MED ORDER — SCOPOLAMINE 1 MG/3DAYS TD PT72
1.0000 | MEDICATED_PATCH | TRANSDERMAL | Status: DC
  Administered 2023-10-12: 1.5 mg via TRANSDERMAL
  Filled 2023-10-12: qty 1

## 2023-10-12 NOTE — Progress Notes (Signed)
Progress Note    Lacey Kemp   WUJ:[id number]  DOB: July 01, 1931  DOA: 10/08/2023     4 PCP: Delma Officer, PA  Initial CC: Weakness, chest pain  Hospital Course: 89 year old female with past medical history significant for coronary artery disease s/p PCI to RCA, hyperlipidemia, diabetes mellitus, hypothyroidism and breast cancer status postlumpectomy.  Patient has been exposed to immediate family with flulike illness.  Patient presented with weakness, chest pain and elevated troponin (1023-20,223).  EKG on presentation revealed STEMI (inferior wall).  Patient was seen by the cardiology team and conservative management has been elected.  Patient is influenza A positive.   Goals of care discussions were held and family elected for transitioning to hospice with comfort care.  Interval History:  Patient less interactive/response this morning. Opens eyes some but unable to talk or follow commands today.  Daughter present bedside.  Still seeking residential hospice placement.  Assessment and Plan:  STEMI CAD - followed by cardiology -Medical management recommended initially - After further GOC discussions, family wishes to transition to comfort care measures -Will discontinue cardiac meds at this time and focus on comfort  GOC - given poor prognosis and overall failure to thrive; poor oral intake and ongoing weight loss, family has elected for transition to hospice at this time  Influenza A - d/c tamiflu; patient not taking PO safely at this time    Hypothyroidism -Continue comfort care       RN Pressure Injury Documentation:     Pressure Injury 10/08/23 Coccyx Left;Right Stage 1 -  Intact skin with non-blanchable redness of a localized area usually over a bony prominence. pink/ red (Active)  10/08/23 1530  Location: Coccyx  Location Orientation: Left;Right  Staging: Stage 1 -  Intact skin with non-blanchable redness of a localized area usually over a bony  prominence.  Wound Description (Comments): pink/ red  Present on Admission: Yes  Dressing Type Foam - Lift dressing to assess site every shift 10/09/23 2100    Foam dressing in place.    Estimated body mass index is 18.88 kg/m as calculated from the following:   Height as of this encounter: 5\' 4"  (1.626 m).   Weight as of this encounter: 49.9 kg.   Old records reviewed in assessment of this patient  Antimicrobials:   DVT prophylaxis:  SCDs Start: 10/08/23 1514   Code Status:   Code Status: Do not attempt resuscitation (DNR) - Comfort care  Mobility Assessment (Last 72 Hours)     Mobility Assessment     Row Name 10/12/23 0726 10/11/23 2000 10/10/23 2000 10/10/23 0800 10/09/23 1944   Does patient have an order for bedrest or is patient medically unstable No - Continue assessment No - Continue assessment No - Continue assessment No - Continue assessment No - Continue assessment   What is the highest level of mobility based on the progressive mobility assessment? Level 1 (Bedfast) - Unable to balance while sitting on edge of bed Level 2 (Chairfast) - Balance while sitting on edge of bed and cannot stand Level 2 (Chairfast) - Balance while sitting on edge of bed and cannot stand Level 2 (Chairfast) - Balance while sitting on edge of bed and cannot stand Level 2 (Chairfast) - Balance while sitting on edge of bed and cannot stand   Is the above level different from baseline mobility prior to current illness? -- No - Consider discontinuing PT/OT No - Consider discontinuing PT/OT Yes - Recommend PT order --  Barriers to discharge: none Disposition Plan:  [facility] Status is: Inpt  Objective: Blood pressure (!) 90/51, pulse (!) 102, temperature (!) 100.7 F (38.2 C), temperature source Axillary, resp. rate (!) 21, height 5\' 4"  (1.626 m), weight 49.9 kg, SpO2 91%.  Examination:  Physical Exam Constitutional:      Comments: Even less responsive today.  Opens eyes to  touch but unable to talk or follow commands  HENT:     Head: Normocephalic and atraumatic.     Mouth/Throat:     Mouth: Mucous membranes are dry.  Eyes:     Extraocular Movements: Extraocular movements intact.  Cardiovascular:     Rate and Rhythm: Normal rate and regular rhythm.  Pulmonary:     Effort: No respiratory distress.     Breath sounds: No wheezing.     Comments: Breathing pattern slightly more irregular Abdominal:     General: Bowel sounds are normal. There is no distension.     Palpations: Abdomen is soft.     Tenderness: There is no abdominal tenderness.  Musculoskeletal:        General: No swelling.     Cervical back: Normal range of motion and neck supple.  Skin:    General: Skin is warm and dry.  Neurological:     Comments: Nonverbal and now no longer following commands      Consultants:  Cardiology   Procedures:    Data Reviewed: No results found for this or any previous visit (from the past 24 hours).  I have reviewed pertinent nursing notes, vitals, labs, and images as necessary. I have ordered labwork to follow up on as indicated.  I have reviewed the last notes from staff over past 24 hours. I have discussed patient's care plan and test results with nursing staff, CM/SW, and other staff as appropriate.     LOS: 4 days   Lewie Chamber, MD [facility] 10/12/2023, 11:59 AM

## 2023-10-12 NOTE — TOC Transition Note (Signed)
Transition of Care [facility]) - Discharge Note   Patient Details  Name: Aronda Wiederholt MRN: [id number] Date of Birth: 02-Jan-1931  Transition of Care [facility]) CM/SW Contact:  Delilah Shan, LCSWA Phone Number: 10/12/2023, 3:14 PM   Clinical Narrative:     Patient will DC to: [facility]   Anticipated DC date: 10/12/2023  Family notified: Clayborne Dana  Transport by: Sharin Mons  ?  Per MD patient ready for DC to [facility] . RN, patient, patient's family,Keka with [facility] and facility notified of DC. Discharge Summary sent to facility. RN given number for report [phone]. DC packet on chart DNR signed by MD attached to patients DC packet. Ambulance transport requested for patient.  CSW signing off.   Final next level of care: [facility]) Barriers to Discharge: No Barriers Identified   Patient Goals and CMS Choice     Choice offered to / list presented to : Adult Children (Patients daughter Clayborne Dana)      Discharge Placement              Patient chooses bed at:  [facility]) Patient to be transferred to facility by: [facility] Name of family member notified: Patti Patient and family notified of of transfer: 10/12/23  Discharge Plan and Services Additional resources added to the After Visit Summary for                                       Social Drivers of Health (SDOH) Interventions SDOH Screenings   Food Insecurity: No Food Insecurity (10/08/2023)  Housing: Low Risk  (10/08/2023)  Transportation Needs: No Transportation Needs (10/08/2023)  Utilities: Not At Risk (10/08/2023)  Tobacco Use: Medium Risk (10/08/2023)     Readmission Risk Interventions     No data to display

## 2023-10-12 NOTE — TOC Progression Note (Addendum)
Transition of Care [facility]) - Progression Note    Patient Details  Name: Lacey Kemp MRN: [id number] Date of Birth: [birth year]  Transition of Care [facility]) CM/SW Contact  Delilah Shan, LCSWA Phone Number: 10/12/2023, 10:09 AM  Clinical Narrative:     CSW spoke with Clayborne Dana patients daughter. Patti informed CSW she plans on touring [facility] and [facility] to help decide which residential hospice she would like patient to go too. Clayborne Dana gave permission for CSW to make referral to [facility]. Patti informed CSW she will give CSW a call back once able to tour both facility's today.No further questions reported at this time. CSW informed MD. CSW called [facility] and spoke with [facility] and made a referral for patient for [facility]. CSW will continue to follow.  Rae Halsted with [facility] informed CSW they can offer residential hospice bed at [facility] for patient. CSW spoke with Clayborne Dana who accepted bed offer. Rae Halsted confirmed that consents have been signed. CSW informed MD.    Barriers to Discharge: No Barriers Identified  Expected Discharge Plan and Services         Expected Discharge Date: 10/11/23                                     Social Determinants of Health (SDOH) Interventions SDOH Screenings   Food Insecurity: No Food Insecurity (10/08/2023)  Housing: Low Risk  (10/08/2023)  Transportation Needs: No Transportation Needs (10/08/2023)  Utilities: Not At Risk (10/08/2023)  Tobacco Use: Medium Risk (10/08/2023)    Readmission Risk Interventions     No data to display

## 2023-10-26 DEATH — deceased

## 2024-01-21 IMAGING — CR DG THORACIC SPINE 3V
3 series · 3 of 3 positions shown · non-contrast
Comparison: Chest radiograph 01/13/2022

CLINICAL DATA: Acute midline thoracic back pain.

EXAM:
THORACIC SPINE - 3 VIEWS

[w t-spine lat *]
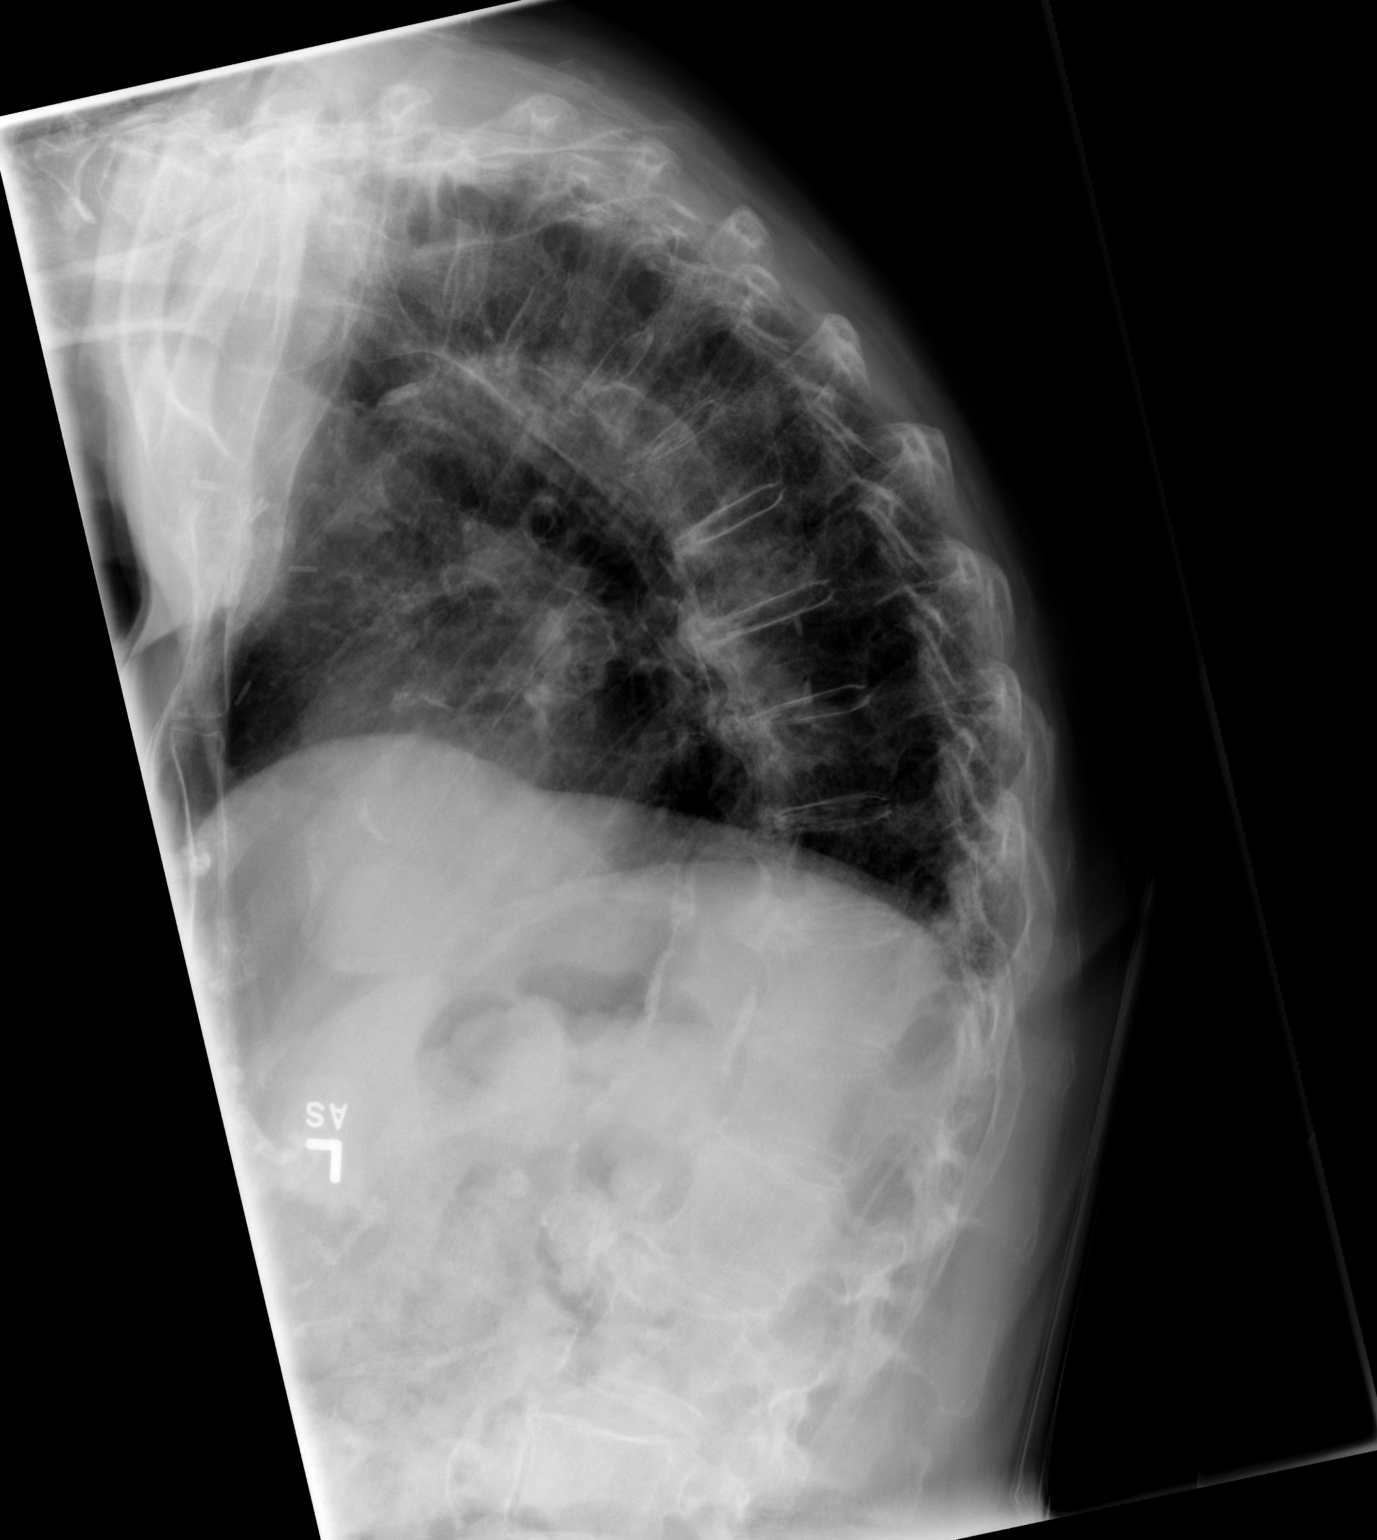

[w swimmers view *]
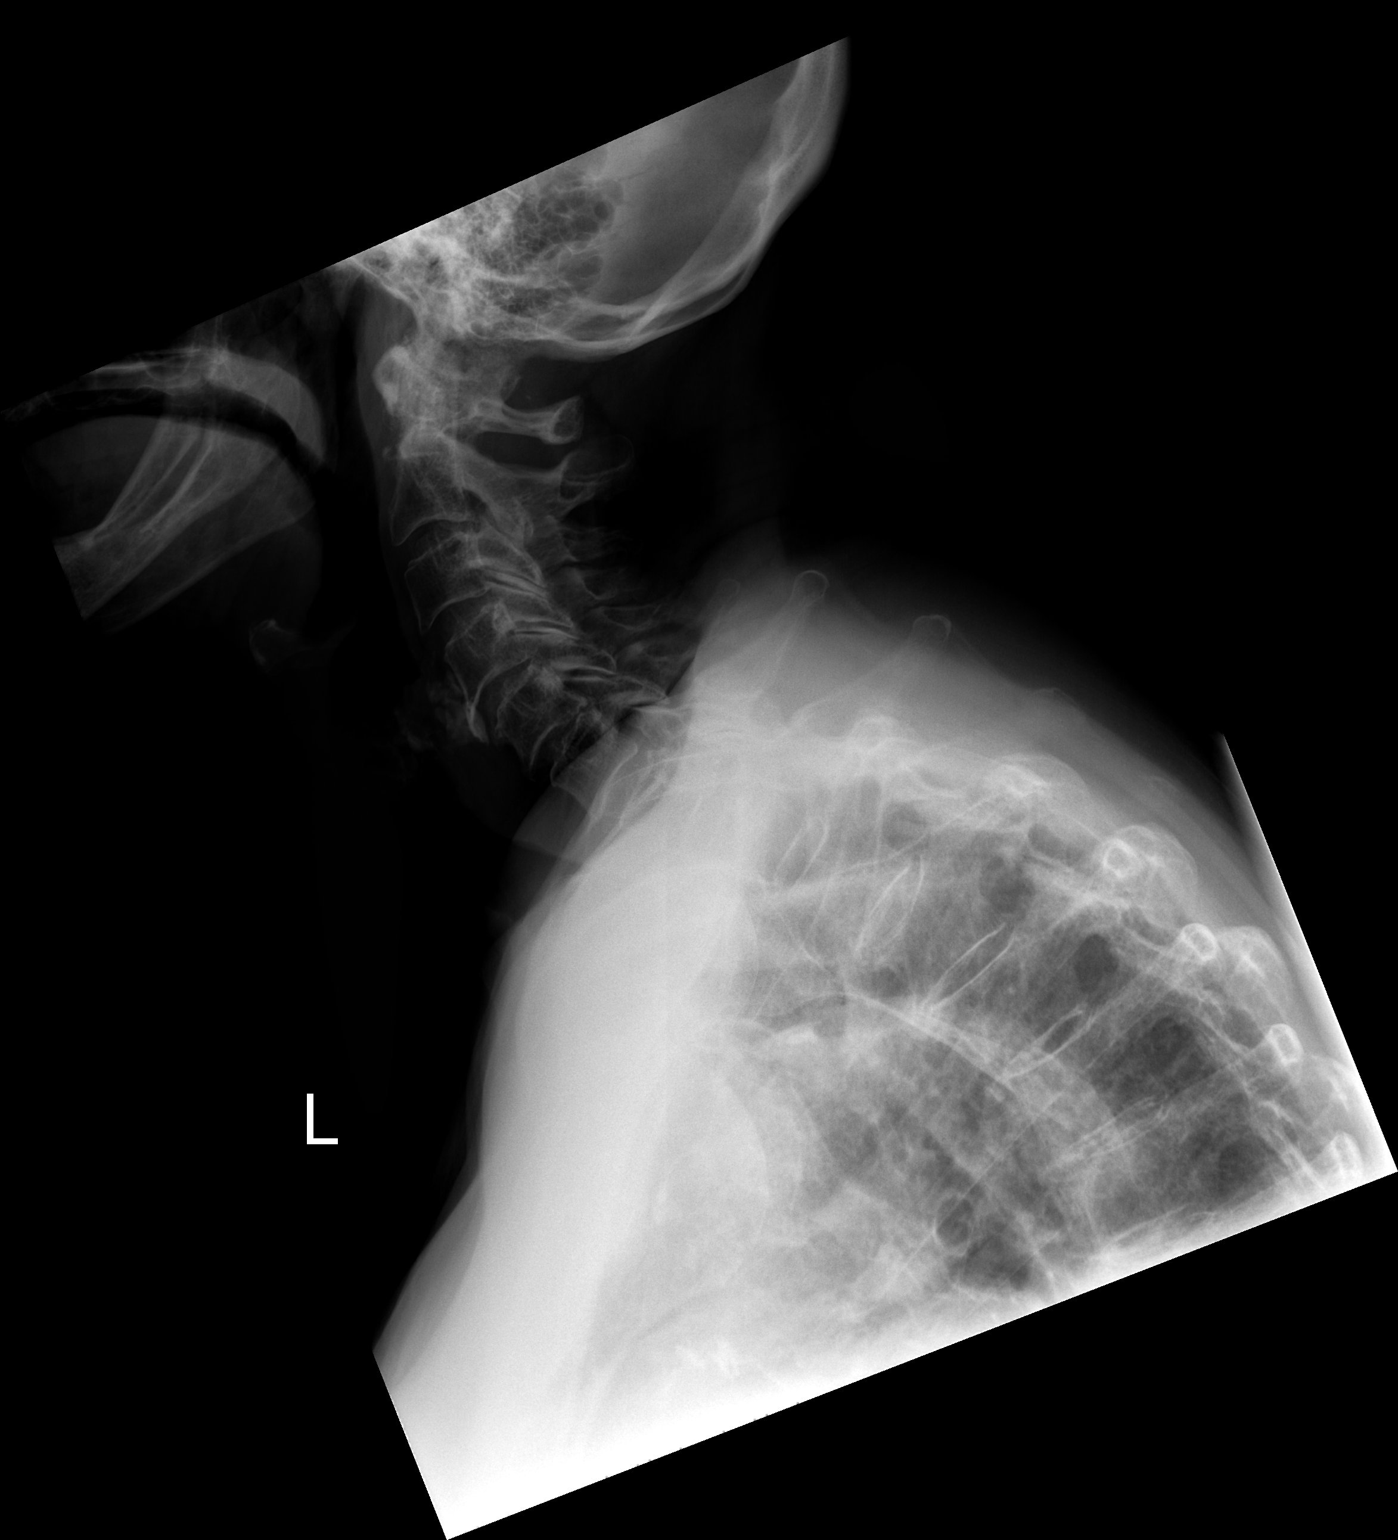

[w t-spine a.p. *]
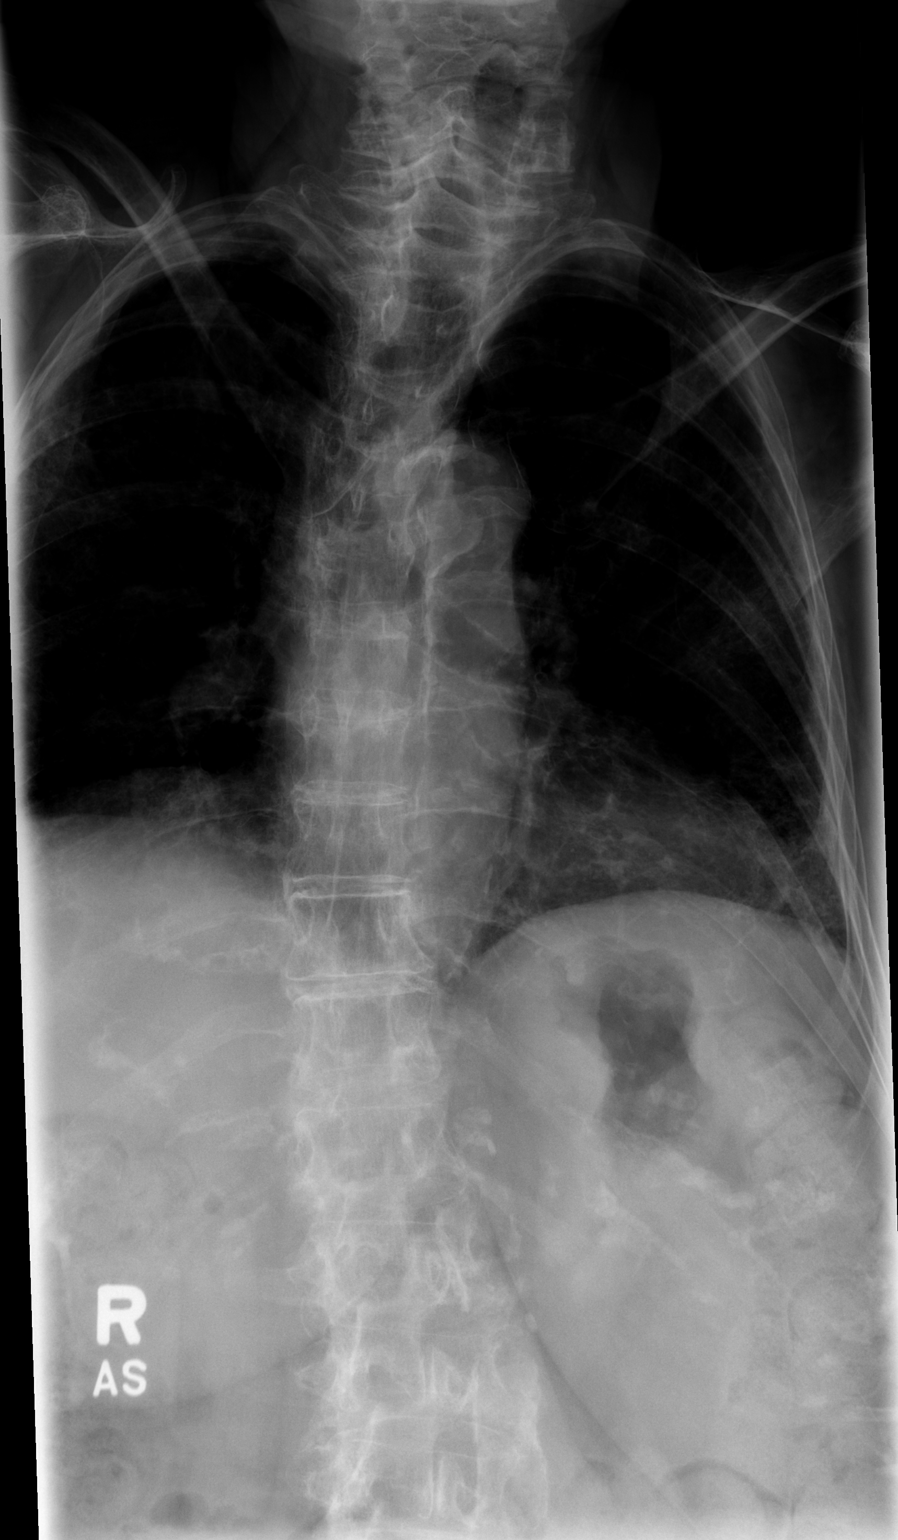

[3 of 3 positions shown; findings below may reference images not displayed]

FINDINGS: Exaggerated thoracic kyphosis. Slight broad-based dextroscoliosis.
There is a mild T3 superior endplate compression deformity,
previously obscured on the prior exam, acuity is uncertain. The
remaining thoracic vertebral body heights are preserved. There is
mild degenerative disc disease in the midthoracic spine. No
paravertebral soft tissue abnormalities. Moderate aortic
atherosclerosis.
IMPRESSION: 1. Mild T3 superior endplate compression deformity, previously
obscured on the prior exam, acuity uncertain. As clinically
indicated, recommend MRI for further assessment.
2. Exaggerated thoracic kyphosis and mild dextroscoliosis. Mild
degenerative disc disease in the midthoracic spine.

## 2024-01-21 IMAGING — CR DG CERVICAL SPINE 2 OR 3 VIEWS
3 series · 3 of 3 positions shown · non-contrast
Comparison: None Available.

CLINICAL DATA: 89-year-old with right-sided neck pain. Pain
radiates into arms.

EXAM:
CERVICAL SPINE - 2-3 VIEW

[w c-spine lat *]
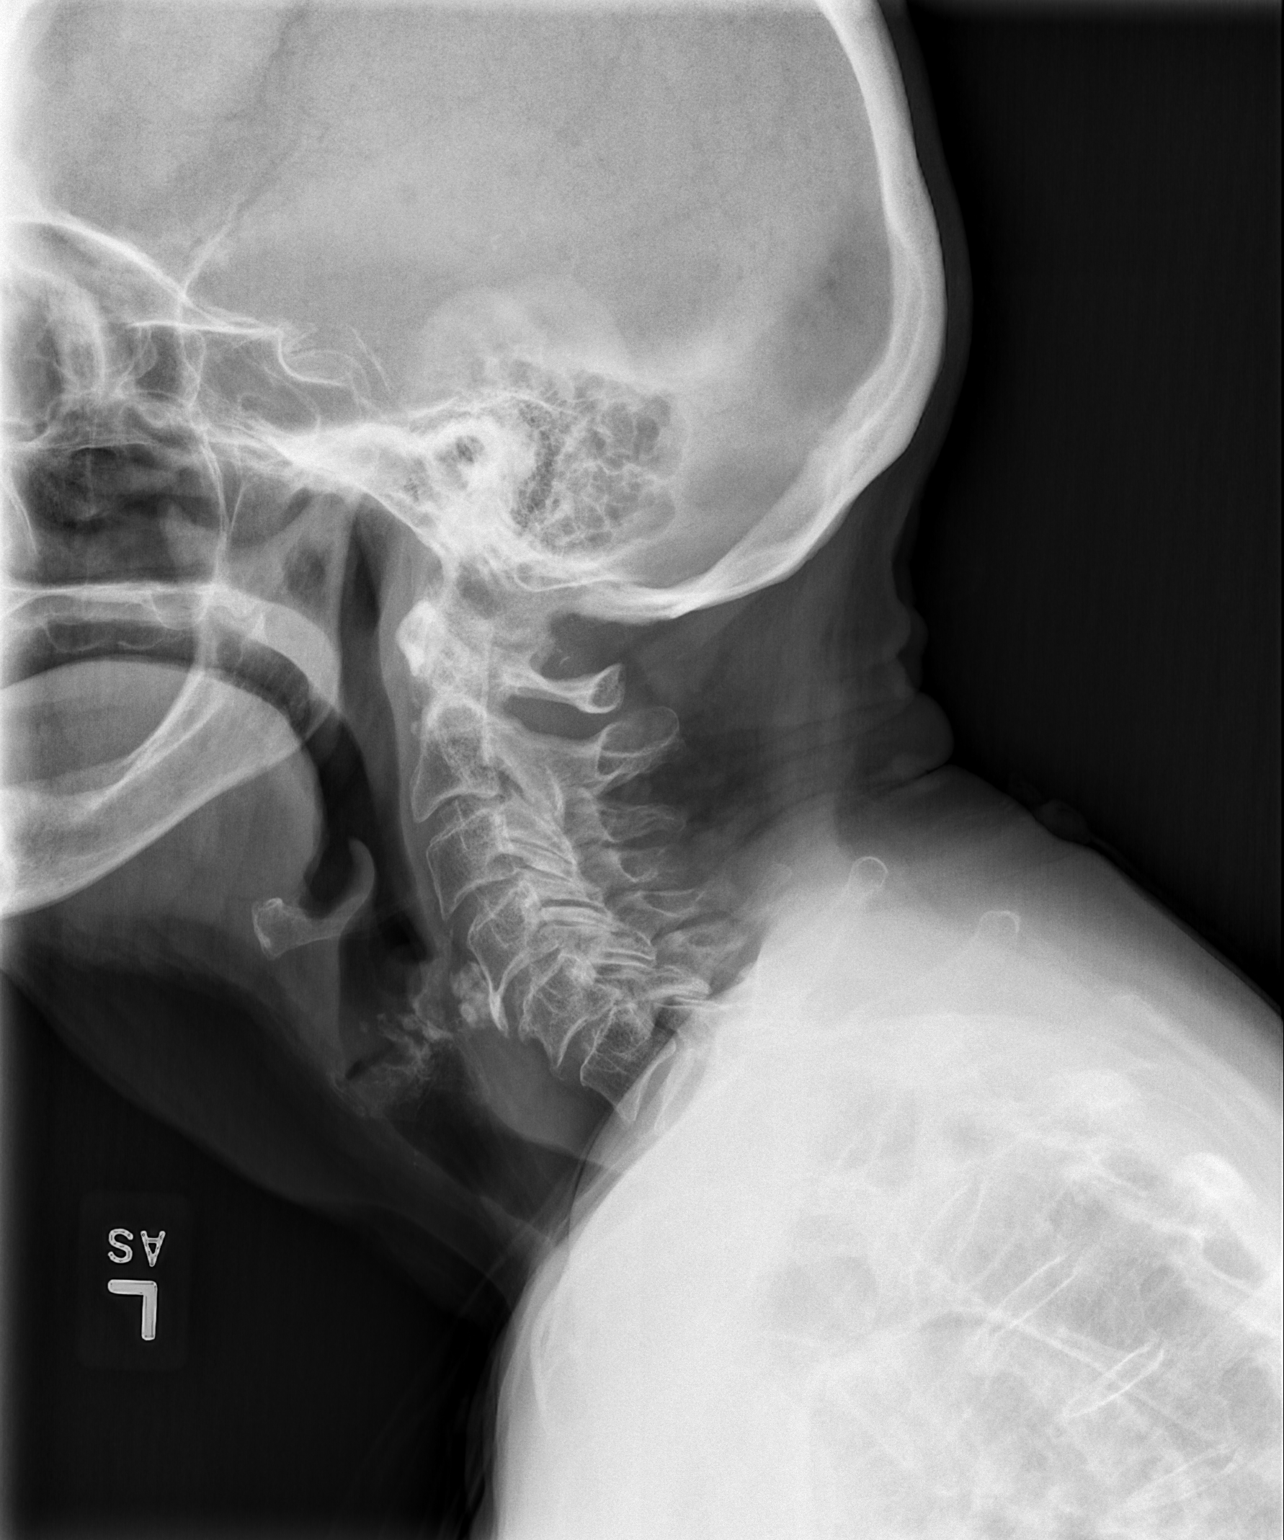

[w c-spine a.p.]
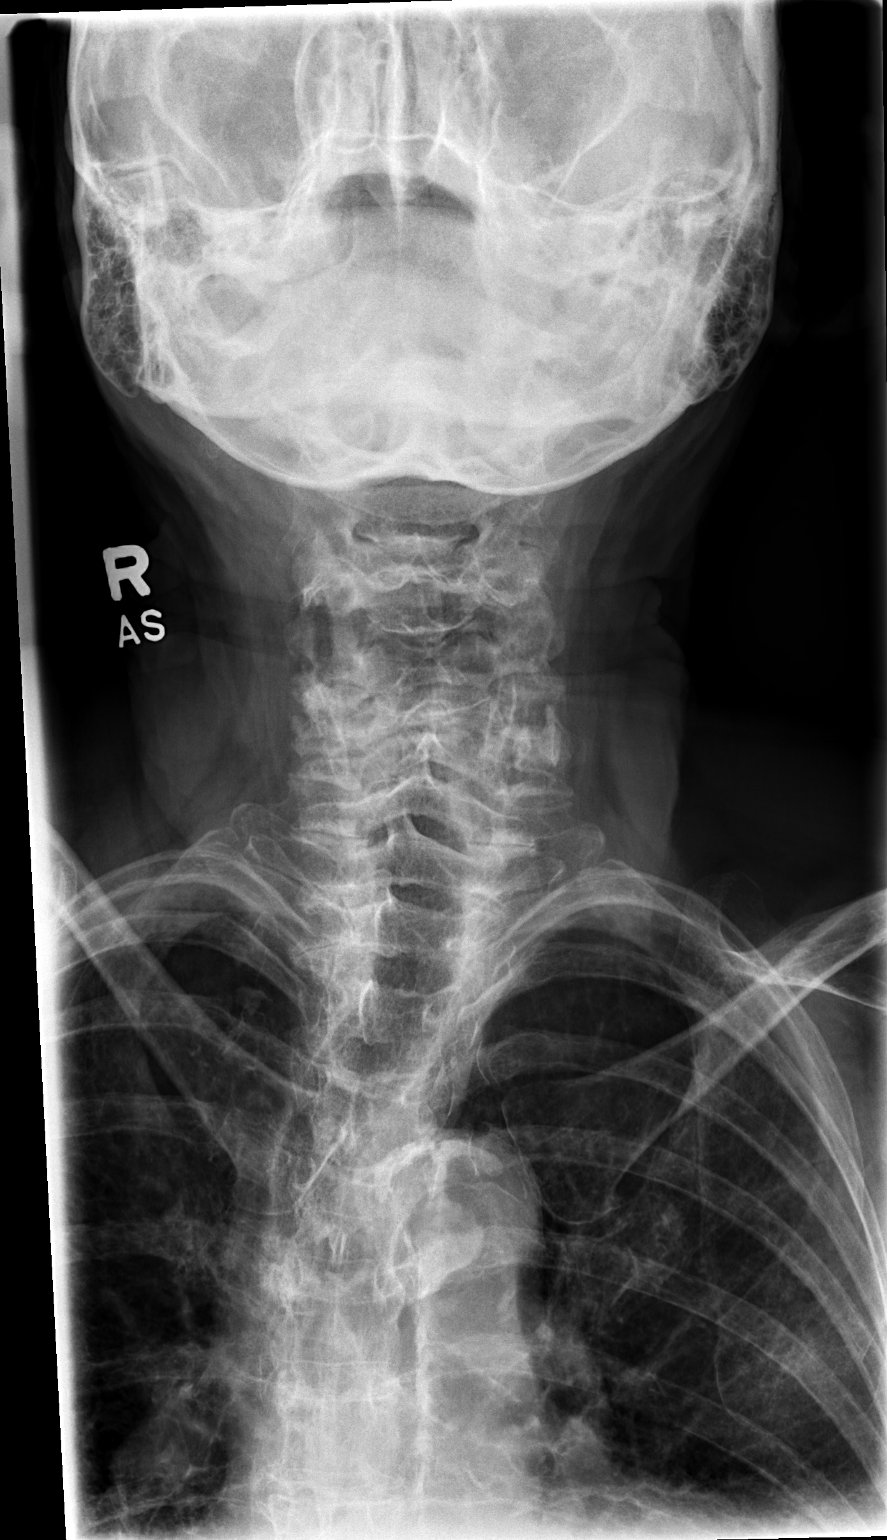

[w c-spine odontoid]
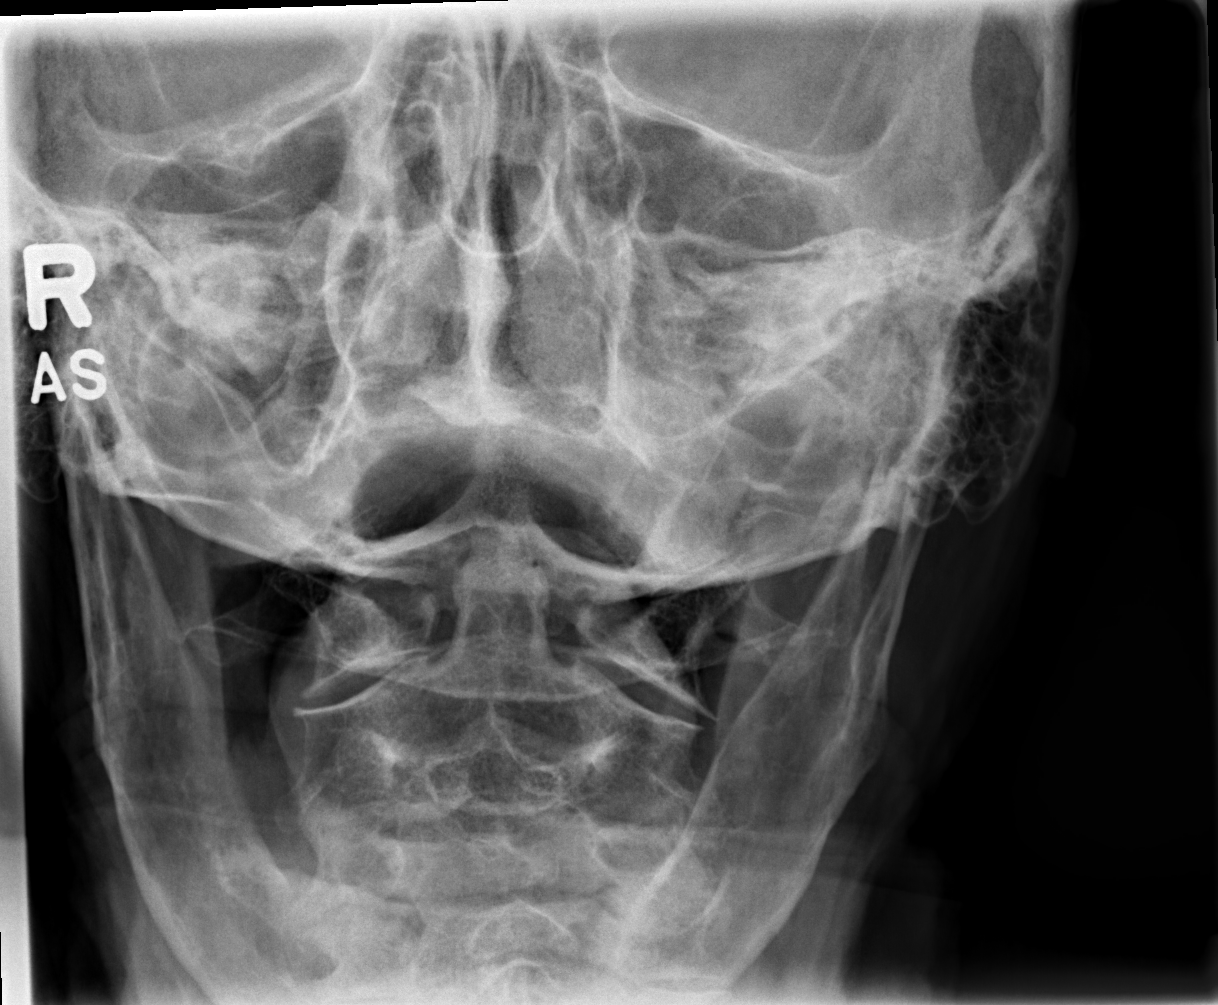

[3 of 3 positions shown; findings below may reference images not displayed]

FINDINGS: Exaggerated cervical lordosis. No listhesis. Degenerative disc
disease with disc space narrowing and spurring at C4-C5 and C5-C6.
There is moderate multilevel facet hypertrophy. Lower aspect of C7
and the cervicothoracic junction are obscured due to overlapping
osseous and soft tissue structures on the lateral view. There is no
evidence of fracture or focal bone abnormality. No prevertebral soft
tissue thickening.
IMPRESSION: Degenerative disc disease at C4-C5 and C5-C6. Moderate multilevel
facet hypertrophy.
